# Patient Record
Sex: Female | Born: 1970 | Hispanic: No | Marital: Married | Smoking: Former smoker
Health system: Southern US, Community
[De-identification: ages and names within clinical notes are randomized; demographics above are authoritative.]

## PROBLEM LIST (undated history)

## (undated) DIAGNOSIS — J45909 Unspecified asthma, uncomplicated: Secondary | ICD-10-CM

## (undated) DIAGNOSIS — K219 Gastro-esophageal reflux disease without esophagitis: Secondary | ICD-10-CM

## (undated) DIAGNOSIS — F32A Depression, unspecified: Secondary | ICD-10-CM

## (undated) DIAGNOSIS — B001 Herpesviral vesicular dermatitis: Secondary | ICD-10-CM

## (undated) DIAGNOSIS — E063 Autoimmune thyroiditis: Secondary | ICD-10-CM

## (undated) DIAGNOSIS — F988 Other specified behavioral and emotional disorders with onset usually occurring in childhood and adolescence: Secondary | ICD-10-CM

## (undated) DIAGNOSIS — Z79899 Other long term (current) drug therapy: Secondary | ICD-10-CM

## (undated) DIAGNOSIS — C4491 Basal cell carcinoma of skin, unspecified: Secondary | ICD-10-CM

## (undated) DIAGNOSIS — F329 Major depressive disorder, single episode, unspecified: Secondary | ICD-10-CM

## (undated) DIAGNOSIS — E079 Disorder of thyroid, unspecified: Secondary | ICD-10-CM

## (undated) DIAGNOSIS — T7840XA Allergy, unspecified, initial encounter: Secondary | ICD-10-CM

## (undated) DIAGNOSIS — F419 Anxiety disorder, unspecified: Secondary | ICD-10-CM

## (undated) DIAGNOSIS — R87619 Unspecified abnormal cytological findings in specimens from cervix uteri: Secondary | ICD-10-CM

## (undated) DIAGNOSIS — N979 Female infertility, unspecified: Secondary | ICD-10-CM

## (undated) DIAGNOSIS — G43909 Migraine, unspecified, not intractable, without status migrainosus: Secondary | ICD-10-CM

## (undated) DIAGNOSIS — A64 Unspecified sexually transmitted disease: Secondary | ICD-10-CM

## (undated) HISTORY — DX: Herpesviral vesicular dermatitis: B00.1

## (undated) HISTORY — DX: Anxiety disorder, unspecified: F41.9

## (undated) HISTORY — DX: Migraine, unspecified, not intractable, without status migrainosus: G43.909

## (undated) HISTORY — DX: Disorder of thyroid, unspecified: E07.9

## (undated) HISTORY — DX: Gastro-esophageal reflux disease without esophagitis: K21.9

## (undated) HISTORY — PX: TONSILLECTOMY: SUR1361

## (undated) HISTORY — PX: BREAST BIOPSY: SHX20

## (undated) HISTORY — PX: HIP ARTHROPLASTY: SHX981

## (undated) HISTORY — DX: Depression, unspecified: F32.A

## (undated) HISTORY — DX: Allergy, unspecified, initial encounter: T78.40XA

## (undated) HISTORY — DX: Autoimmune thyroiditis: E06.3

## (undated) HISTORY — DX: Unspecified asthma, uncomplicated: J45.909

## (undated) HISTORY — DX: Female infertility, unspecified: N97.9

## (undated) HISTORY — DX: Unspecified sexually transmitted disease: A64

## (undated) HISTORY — PX: BREAST EXCISIONAL BIOPSY: SUR124

## (undated) HISTORY — DX: Other specified behavioral and emotional disorders with onset usually occurring in childhood and adolescence: F98.8

---
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
## 1994-08-26 HISTORY — PX: NASAL SEPTUM SURGERY: SHX37

## 2013-08-26 DIAGNOSIS — C4491 Basal cell carcinoma of skin, unspecified: Secondary | ICD-10-CM

## 2013-08-26 HISTORY — DX: Basal cell carcinoma of skin, unspecified: C44.91

## 2014-08-26 DIAGNOSIS — R87619 Unspecified abnormal cytological findings in specimens from cervix uteri: Secondary | ICD-10-CM

## 2014-08-26 HISTORY — DX: Unspecified abnormal cytological findings in specimens from cervix uteri: R87.619

## 2015-08-27 HISTORY — PX: ABLATION: SHX5711

## 2015-08-27 HISTORY — PX: OTHER SURGICAL HISTORY: SHX169

## 2016-08-26 DIAGNOSIS — Z79899 Other long term (current) drug therapy: Secondary | ICD-10-CM

## 2016-08-26 HISTORY — DX: Other long term (current) drug therapy: Z79.899

## 2019-01-08 ENCOUNTER — Telehealth: Payer: Self-pay | Admitting: *Deleted

## 2019-01-08 ENCOUNTER — Encounter: Payer: Self-pay | Admitting: Family Medicine

## 2019-01-08 ENCOUNTER — Ambulatory Visit (INDEPENDENT_AMBULATORY_CARE_PROVIDER_SITE_OTHER): Payer: Managed Care, Other (non HMO) | Admitting: Family Medicine

## 2019-01-08 ENCOUNTER — Other Ambulatory Visit: Payer: Self-pay

## 2019-01-08 DIAGNOSIS — Z1239 Encounter for other screening for malignant neoplasm of breast: Secondary | ICD-10-CM

## 2019-01-08 DIAGNOSIS — R928 Other abnormal and inconclusive findings on diagnostic imaging of breast: Secondary | ICD-10-CM

## 2019-01-08 DIAGNOSIS — F321 Major depressive disorder, single episode, moderate: Secondary | ICD-10-CM | POA: Diagnosis not present

## 2019-01-08 DIAGNOSIS — E669 Obesity, unspecified: Secondary | ICD-10-CM | POA: Diagnosis not present

## 2019-01-08 DIAGNOSIS — C4491 Basal cell carcinoma of skin, unspecified: Secondary | ICD-10-CM

## 2019-01-08 DIAGNOSIS — Z8639 Personal history of other endocrine, nutritional and metabolic disease: Secondary | ICD-10-CM

## 2019-01-08 DIAGNOSIS — R5383 Other fatigue: Secondary | ICD-10-CM | POA: Diagnosis not present

## 2019-01-08 DIAGNOSIS — Z13220 Encounter for screening for lipoid disorders: Secondary | ICD-10-CM

## 2019-01-08 DIAGNOSIS — Z131 Encounter for screening for diabetes mellitus: Secondary | ICD-10-CM

## 2019-01-08 DIAGNOSIS — F988 Other specified behavioral and emotional disorders with onset usually occurring in childhood and adolescence: Secondary | ICD-10-CM

## 2019-01-08 DIAGNOSIS — F1010 Alcohol abuse, uncomplicated: Secondary | ICD-10-CM

## 2019-01-08 DIAGNOSIS — B001 Herpesviral vesicular dermatitis: Secondary | ICD-10-CM

## 2019-01-08 MED ORDER — BUPROPION HCL ER (SR) 150 MG PO TB12: ORAL_TABLET | ORAL | 2 refills | Status: DC

## 2019-01-08 MED ORDER — NALTREXONE HCL 50 MG PO TABS: 50.0000 mg | ORAL_TABLET | Freq: Every day | ORAL | 2 refills | Status: DC

## 2019-01-08 NOTE — Telephone Encounter (Signed)
I called the pt and scheduled a lab appt and follow up visit.  Patient was given the phone number to contact [facility] at [phone] and PHQ-9 entered in the chart.

## 2019-01-08 NOTE — Telephone Encounter (Signed)
-----   Message from Caren Macadam, MD sent at 01/08/2019  1:56 PM EDT ----- Schedule bloodwork soon please. Schedule follow up doxy in 2 weeks. Please give behavioral health # to her. Please complete phq9 on her. I will put in lab orders shortly.
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
## 2019-01-15 ENCOUNTER — Other Ambulatory Visit (INDEPENDENT_AMBULATORY_CARE_PROVIDER_SITE_OTHER): Payer: Managed Care, Other (non HMO)

## 2019-01-15 ENCOUNTER — Other Ambulatory Visit: Payer: Self-pay | Admitting: Family Medicine

## 2019-01-15 ENCOUNTER — Other Ambulatory Visit: Payer: Self-pay

## 2019-01-15 DIAGNOSIS — Z8639 Personal history of other endocrine, nutritional and metabolic disease: Secondary | ICD-10-CM | POA: Diagnosis not present

## 2019-01-15 DIAGNOSIS — Z13220 Encounter for screening for lipoid disorders: Secondary | ICD-10-CM

## 2019-01-15 DIAGNOSIS — Z131 Encounter for screening for diabetes mellitus: Secondary | ICD-10-CM

## 2019-01-15 DIAGNOSIS — R5383 Other fatigue: Secondary | ICD-10-CM | POA: Diagnosis not present

## 2019-01-15 LAB — LIPID PANEL
Cholesterol: 179 mg/dL (ref 0–200)
HDL: 88.4 mg/dL (ref >39.00)
LDL Cholesterol: 82 mg/dL (ref 0–99)
NonHDL: 90.32
Total CHOL/HDL Ratio: 2
Triglycerides: 41 mg/dL (ref 0.0–149.0)
VLDL: 8.2 mg/dL (ref 0.0–40.0)

## 2019-01-15 LAB — CBC WITH DIFFERENTIAL/PLATELET
Basophils Absolute: 0.1 10*3/uL (ref 0.0–0.1)
Basophils Relative: 1.1 % (ref 0.0–3.0)
Eosinophils Absolute: 0.3 10*3/uL (ref 0.0–0.7)
Eosinophils Relative: 5.9 % — ABNORMAL HIGH (ref 0.0–5.0)
HCT: 40.3 % (ref 36.0–46.0)
Hemoglobin: 13.9 g/dL (ref 12.0–15.0)
Lymphocytes Relative: 33 % (ref 12.0–46.0)
Lymphs Abs: 1.7 10*3/uL (ref 0.7–4.0)
MCHC: 34.6 g/dL (ref 30.0–36.0)
MCV: 93.1 fl (ref 78.0–100.0)
Monocytes Absolute: 0.4 10*3/uL (ref 0.1–1.0)
Monocytes Relative: 8.1 % (ref 3.0–12.0)
Neutro Abs: 2.6 10*3/uL (ref 1.4–7.7)
Neutrophils Relative %: 51.9 % (ref 43.0–77.0)
Platelets: 265 10*3/uL (ref 150.0–400.0)
RBC: 4.32 Mil/uL (ref 3.87–5.11)
RDW: 13.5 % (ref 11.5–15.5)
WBC: 5 10*3/uL (ref 4.0–10.5)

## 2019-01-15 LAB — COMPREHENSIVE METABOLIC PANEL
ALT: 12 U/L (ref 0–35)
AST: 15 U/L (ref 0–37)
Albumin: 4.7 g/dL (ref 3.5–5.2)
Alkaline Phosphatase: 42 U/L (ref 39–117)
BUN: 11 mg/dL (ref 6–23)
CO2: 28 mEq/L (ref 19–32)
Calcium: 9.5 mg/dL (ref 8.4–10.5)
Chloride: 102 mEq/L (ref 96–112)
Creatinine, Ser: 0.77 mg/dL (ref 0.40–1.20)
GFR: 79.75 mL/min (ref >60.00)
Glucose, Bld: 83 mg/dL (ref 70–99)
Potassium: 4.2 mEq/L (ref 3.5–5.1)
Sodium: 139 mEq/L (ref 135–145)
Total Bilirubin: 0.9 mg/dL (ref 0.2–1.2)
Total Protein: 7 g/dL (ref 6.0–8.3)

## 2019-01-15 LAB — VITAMIN B12: Vitamin B-12: 401 pg/mL (ref 211–911)

## 2019-01-15 LAB — TSH: TSH: 2.22 u[IU]/mL (ref 0.35–4.50)

## 2019-01-15 LAB — HEMOGLOBIN A1C: Hgb A1c MFr Bld: 5.1 % (ref 4.6–6.5)

## 2019-01-15 LAB — VITAMIN D 25 HYDROXY (VIT D DEFICIENCY, FRACTURES): VITD: 27.21 ng/mL — ABNORMAL LOW (ref 30.00–100.00)

## 2019-01-15 LAB — T3, FREE: T3, Free: 3.2 pg/mL (ref 2.3–4.2)

## 2019-01-15 LAB — T4, FREE: Free T4: 0.81 ng/dL (ref 0.60–1.60)

## 2019-01-19 LAB — B. BURGDORFI ANTIBODIES: B burgdorferi Ab IgG+IgM: <0.9 index

## 2019-01-22 ENCOUNTER — Ambulatory Visit (INDEPENDENT_AMBULATORY_CARE_PROVIDER_SITE_OTHER): Payer: Managed Care, Other (non HMO) | Admitting: Family Medicine

## 2019-01-22 ENCOUNTER — Encounter: Payer: Self-pay | Admitting: Family Medicine

## 2019-01-22 ENCOUNTER — Telehealth: Payer: Self-pay | Admitting: *Deleted

## 2019-01-22 ENCOUNTER — Other Ambulatory Visit: Payer: Self-pay

## 2019-01-22 DIAGNOSIS — F321 Major depressive disorder, single episode, moderate: Secondary | ICD-10-CM

## 2019-01-22 DIAGNOSIS — F988 Other specified behavioral and emotional disorders with onset usually occurring in childhood and adolescence: Secondary | ICD-10-CM | POA: Diagnosis not present

## 2019-01-22 MED ORDER — VYVANSE 50 MG PO CAPS: 50.0000 mg | ORAL_CAPSULE | Freq: Every day | ORAL | 0 refills | Status: DC

## 2019-01-22 MED ORDER — ESCITALOPRAM OXALATE 10 MG PO TABS: 10.0000 mg | ORAL_TABLET | Freq: Every day | ORAL | 1 refills | Status: DC

## 2019-01-22 NOTE — Telephone Encounter (Signed)
See results note. 

## 2019-01-22 NOTE — Telephone Encounter (Signed)
Copied from [place] [id number]. Topic: General - Other >> Jan 21, 2019  4:20 PM Celene Kras A wrote: Reason for CRM: Pt is returning call that she received. Please advise. >> Jan 21, 2019  4:48 PM Cox, Melburn Hake, CMA wrote: [facility] Agent did not see CRM giving permission to NT to give results to pt.

## 2019-01-22 NOTE — Progress Notes (Signed)
Virtual Visit via Video Note  I connected with Yolanda Santana  on 01/22/19 at 10:30 AM EDT by a video enabled telemedicine application and verified that I am speaking with the correct person using two identifiers.  Location patient: home Location provider:work or home office Persons participating in the virtual visit: patient, provider  I discussed the limitations of evaluation and management by telemedicine and the availability of in person appointments. The patient expressed understanding and agreed to proceed.   Yolanda Santana DOB: [birth year] Encounter date: 01/22/2019  This is a 48 y.o. female who presents with Chief Complaint  Patient presents with  . Follow-up    discuss labs    History of present illness: Last visit 5/15 (new patient) we discussed multiple concerns including mood/alcohol intake. Wellbutrin was added on to help with depression and naltrexone was also started. labwork completed which was stable.  HPI  Has done some thinking since our last discussion. Feels that everything going on is very situational. New move, furloughed, COVID, then going back to work, limiting exposure to kids, just a lot of stress and ups and downs.  House in [place] is still on market which is stressful. Current husband is not working right now. Feels that this will get better on own. Wanting to just work on self and get back in routine. Didn't start any medication because she felt like she could get this to turn on own.   Doing better with alcohol intake. Has cut back on this.   Worried regularly about her weight. Working on Chief Technology Officer. Likes to walk for exercise.   Needs refills on vyvanse. Stable on this medication. Uses adderall only if needed for break through focus issues.    Allergies  Allergen Reactions  . Penicillins Rash   Current Meds  Medication Sig  . amphetamine-dextroamphetamine (ADDERALL) 20 MG tablet Take 20 mg by mouth as needed.   . Cholecalciferol (VITAMIN D-3 PO) Take 5,000  Units by mouth every other day.  . escitalopram (LEXAPRO) 10 MG tablet Take 1 tablet (10 mg total) by mouth daily.  . fexofenadine (ALLEGRA) 180 MG tablet Take 180 mg by mouth as needed for allergies or rhinitis.  Marland Kitchen [place] Life total mins iron free-once a day  . OVER THE COUNTER MEDICATION Natures Life supplement-no iron-once a day  . valACYclovir (VALTREX) 1000 MG tablet 2,000 mg as needed.  Marland Kitchen VYVANSE 50 MG capsule Take 1 capsule (50 mg total) by mouth daily.  . [DISCONTINUED] buPROPion (WELLBUTRIN SR) 150 MG 12 hr tablet Start with 1 tab daily x 3 days, then increase to BID  . [DISCONTINUED] escitalopram (LEXAPRO) 10 MG tablet Take 10 mg by mouth daily.  . [DISCONTINUED] naltrexone (DEPADE) 50 MG tablet Take 1 tablet (50 mg total) by mouth daily.  . [DISCONTINUED] VYVANSE 50 MG capsule Take 50 mg by mouth daily.     Review of Systems  Constitutional: Negative for chills, fatigue and fever.  Respiratory: Negative for cough, chest tightness, shortness of breath and wheezing.   Cardiovascular: Negative for chest pain, palpitations and leg swelling.  Psychiatric/Behavioral: Nervous/anxious: feeling better overall.     Objective:  There were no vitals taken for this visit.      BP Readings from Last 3 Encounters:  No data found for BP   Wt Readings from Last 3 Encounters:  No data found for Wt    EXAM:  GENERAL: alert, oriented, appears well and in no acute distress  HEENT: atraumatic,  conjunctiva clear, no obvious abnormalities on inspection of external nose and ears  NECK: normal movements of the head and neck  LUNGS: on inspection no signs of respiratory distress, breathing rate appears normal, no obvious gross SOB, gasping or wheezing  CV: no obvious cyanosis  MS: moves all visible extremities without noticeable abnormality  PSYCH/NEURO: pleasant and cooperative, no obvious depression or anxiety, speech and thought processing grossly  intact  Assessment/Plan  1. Attention deficit disorder, unspecified hyperactivity presence Controlled.  Continue current medication.  2. Depression, major, single episode, moderate ([place]) At this point she wishes to remain on the Lexapro and not add any additional medication.  She is going to work on getting regular exercise.  We discussed working on more positive self talk.   Return in about 2 months (around 03/24/2019) for physical exam.    I discussed the assessment and treatment plan with the patient. The patient was provided an opportunity to ask questions and all were answered. The patient agreed with the plan and demonstrated an understanding of the instructions.   The patient was advised to call back or seek an in-person evaluation if the symptoms worsen or if the condition fails to improve as anticipated.  I provided 15 minutes of non-face-to-face time during this encounter.   Micheline Rough, MD

## 2019-01-26 ENCOUNTER — Telehealth: Payer: Self-pay | Admitting: *Deleted

## 2019-01-26 NOTE — Telephone Encounter (Signed)
-----   Message from Caren Macadam, MD sent at 01/22/2019 10:51 AM EDT ----- Please schedule in office physical in 2 mo time.

## 2019-01-26 NOTE — Telephone Encounter (Signed)
I left a detailed message at the pts cell number to call back with an appt as below.

## 2019-02-22 ENCOUNTER — Encounter: Payer: Self-pay | Admitting: Family Medicine

## 2019-02-22 ENCOUNTER — Other Ambulatory Visit: Payer: Self-pay

## 2019-02-22 ENCOUNTER — Ambulatory Visit (INDEPENDENT_AMBULATORY_CARE_PROVIDER_SITE_OTHER): Payer: Managed Care, Other (non HMO) | Admitting: Family Medicine

## 2019-02-22 VITALS — BP 102/80 | HR 93 | Temp 98.7°F | Ht 65.5 in | Wt 167.2 lb

## 2019-02-22 DIAGNOSIS — H6982 Other specified disorders of Eustachian tube, left ear: Secondary | ICD-10-CM

## 2019-02-22 NOTE — Progress Notes (Signed)
   Subjective:    Patient ID: Ann Lions, female    DOB: 1971-02-07, 48 y.o.   MRN: [id number]  HPI Here for longstanding fullness and muffled hearing in the left ear. There is no pain. This started months ago, but when she moved to [place] from [place] 4 months ago it got worse. She tried to clean her ear out with a Q Tip yesterday and she saw some blood on the tip of it. No sinus pressure or ST or fever.    Review of Systems  Constitutional: Negative.   HENT: Positive for hearing loss. Negative for congestion, ear discharge, ear pain, postnasal drip, sinus pressure, sinus pain and sore throat.   Eyes: Negative.   Respiratory: Negative.        Objective:   Physical Exam Constitutional:      Appearance: Normal appearance.  HENT:     Right Ear: Tympanic membrane, ear canal and external ear normal.     Left Ear: Tympanic membrane and external ear normal.     Ears:     Comments: The left canal is clear but there are several tiny abrasions with dried blood present     Nose: Nose normal.     Mouth/Throat:     Pharynx: Oropharynx is clear.  Eyes:     Conjunctiva/sclera: Conjunctivae normal.  Pulmonary:     Effort: Pulmonary effort is normal.     Breath sounds: Normal breath sounds.  Lymphadenopathy:     Cervical: No cervical adenopathy.  Neurological:     Mental Status: She is alert.           Assessment & Plan:  She has eustachian tube dysfunction and she recently abraded the canal. I told her to never put anything, including Q Tips, in the ear canals. She will try Allegra and Flonase every day.  Alysia Penna, MD

## 2019-03-10 ENCOUNTER — Telehealth: Payer: Self-pay | Admitting: Family Medicine

## 2019-03-10 ENCOUNTER — Other Ambulatory Visit: Payer: Self-pay | Admitting: Family Medicine

## 2019-03-10 MED ORDER — VYVANSE 50 MG PO CAPS: 50.0000 mg | ORAL_CAPSULE | Freq: Every day | ORAL | 0 refills | Status: DC

## 2019-03-10 NOTE — Telephone Encounter (Signed)
Medication Refill - Medication: VYVANSE 50 MG capsule and amphetamine-dextroamphetamine (ADDERALL) 20 MG tablet     Preferred Pharmacy (with phone number or street name):  [id number] - [place], [address]. AT [address] [phone] (Phone) [phone] (Fax)

## 2019-03-18 ENCOUNTER — Telehealth: Payer: Self-pay | Admitting: *Deleted

## 2019-03-18 NOTE — Telephone Encounter (Signed)
Copied from [place] [id number]. Topic: Appointment Scheduling - Scheduling Inquiry for Clinic >> Mar 18, 2019  1:45 PM Percell Belt A wrote: Reason for CRM: pt would called and like to see if there is a cancellation tomorrow for cpe.  If not she would like to try and get something sooner  Best number  [phone]  CLinic RN called patient. No answer. Unable to LVM

## 2019-03-29 NOTE — Telephone Encounter (Signed)
I called the pt and rescheduled CPE to 8/5 due to cancellation.

## 2019-03-31 ENCOUNTER — Ambulatory Visit (INDEPENDENT_AMBULATORY_CARE_PROVIDER_SITE_OTHER): Payer: Managed Care, Other (non HMO) | Admitting: Family Medicine

## 2019-03-31 ENCOUNTER — Other Ambulatory Visit (HOSPITAL_COMMUNITY)
Admission: RE | Admit: 2019-03-31 | Discharge: 2019-03-31 | Disposition: A | Discharge status: Home / Self Care | Payer: Managed Care, Other (non HMO) | Source: Ambulatory Visit | Attending: Family Medicine | Admitting: Family Medicine

## 2019-03-31 ENCOUNTER — Other Ambulatory Visit: Payer: Self-pay

## 2019-03-31 ENCOUNTER — Encounter: Payer: Self-pay | Admitting: Family Medicine

## 2019-03-31 VITALS — BP 110/72 | HR 84 | Temp 97.2°F | Ht 64.75 in | Wt 173.4 lb

## 2019-03-31 DIAGNOSIS — Z0000 Encounter for general adult medical examination without abnormal findings: Secondary | ICD-10-CM

## 2019-03-31 DIAGNOSIS — K582 Mixed irritable bowel syndrome: Secondary | ICD-10-CM

## 2019-03-31 DIAGNOSIS — Z124 Encounter for screening for malignant neoplasm of cervix: Secondary | ICD-10-CM

## 2019-03-31 DIAGNOSIS — D229 Melanocytic nevi, unspecified: Secondary | ICD-10-CM

## 2019-03-31 DIAGNOSIS — F321 Major depressive disorder, single episode, moderate: Secondary | ICD-10-CM

## 2019-03-31 DIAGNOSIS — E663 Overweight: Secondary | ICD-10-CM

## 2019-03-31 DIAGNOSIS — H60549 Acute eczematoid otitis externa, unspecified ear: Secondary | ICD-10-CM

## 2019-03-31 DIAGNOSIS — F1010 Alcohol abuse, uncomplicated: Secondary | ICD-10-CM

## 2019-03-31 MED ORDER — NALTREXONE HCL 50 MG PO TABS: 50.0000 mg | ORAL_TABLET | Freq: Every day | ORAL | 2 refills | Status: DC

## 2019-03-31 MED ORDER — NEOMYCIN-POLYMYXIN-HC 3.5-10000-1 OT SOLN: 3.0000 [drp] | Freq: Four times a day (QID) | OTIC | 0 refills | Status: DC | PRN

## 2019-03-31 MED ORDER — BUPROPION HCL ER (SR) 150 MG PO TB12: ORAL_TABLET | ORAL | 2 refills | Status: DC
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
## 2019-03-31 NOTE — Patient Instructions (Signed)
Why is Exercise Important? If I told you I had a single pill that would help you decrease stress by improving anxiety, decreasing depression, help you achieve a healthy weight, give you more energy, make you more productive, help you focus, decrease your risk of dementia/heart attack/stroke/falls, improve your bone health, and more would you be interested? These are just some of the benefits that exercise brings to you. IT IS WORTH carving out some time every day to fit in exercise. It will help in every aspect of your health. Even if you have injuries that prevent you from participating in a type of exercise you used to do; there is always something that you can do to keep exercise a part of your life. If improving your health is important, make exercise your priority. It is worth the time! If you have questions about the type of exercise that is right for you, please talk with me about this!     Exercising to Stay Healthy  Exercising regularly is important. It has many health benefits, such as:  Improving your overall fitness, flexibility, and endurance.  Increasing your bone density.  Helping with weight control.  Decreasing your body fat.  Increasing your muscle strength.  Reducing stress and tension.  Improving your overall health.   In order to become healthy and stay healthy, it is recommended that you do moderate-intensity and vigorous-intensity exercise. You can tell that you are exercising at a moderate intensity if you have a higher heart rate and faster breathing, but you are still able to hold a conversation. You can tell that you are exercising at a vigorous intensity if you are breathing much harder and faster and cannot hold a conversation while exercising. How often should I exercise? Choose an activity that you enjoy and set realistic goals. Your health care provider can help you to make an activity plan that works for you. Exercise regularly as directed by your health care  provider. This may include:  Doing resistance training twice each week, such as: ? Push-ups. ? Sit-ups. ? Lifting weights. ? Using resistance bands.  Doing a given intensity of exercise for a given amount of time. Choose from these options: ? 150 minutes of moderate-intensity exercise every week. ? 75 minutes of vigorous-intensity exercise every week. ? A mix of moderate-intensity and vigorous-intensity exercise every week.   Children, pregnant women, people who are out of shape, people who are overweight, and older adults may need to consult a health care provider for individual recommendations. If you have any sort of medical condition, be sure to consult your health care provider before starting a new exercise program. What are some exercise ideas? Some moderate-intensity exercise ideas include:  Walking at a rate of 1 mile in 15 minutes.  Biking.  Hiking.  Golfing.  Dancing.   Some vigorous-intensity exercise ideas include:  Walking at a rate of at least 4.5 miles per hour.  Jogging or running at a rate of 5 miles per hour.  Biking at a rate of at least 10 miles per hour.  Lap swimming.  Roller-skating or in-line skating.  Cross-country skiing.  Vigorous competitive sports, such as football, basketball, and soccer.  Jumping rope.  Aerobic dancing.   What are some everyday activities that can help me to get exercise?  Yard work, such as: ? Pushing a lawn mower. ? Raking and bagging leaves.  Washing and waxing your car.  Pushing a stroller.  Shoveling snow.  Gardening.  Washing windows or   floors. How can I be more active in my day-to-day activities?  Use the stairs instead of the elevator.  Take a walk during your lunch break.  If you drive, park your car farther away from work or school.  If you take public transportation, get off one stop early and walk the rest of the way.  Make all of your phone calls while standing up and walking  around.  Get up, stretch, and walk around every 30 minutes throughout the day. What guidelines should I follow while exercising?  Do not exercise so much that you hurt yourself, feel dizzy, or get very short of breath.  Consult your health care provider before starting a new exercise program.  Wear comfortable clothes and shoes with good support.  Drink plenty of water while you exercise to prevent dehydration or heat stroke. Body water is lost during exercise and must be replaced.  Work out until you breathe faster and your heart beats faster. This information is not intended to replace advice given to you by your health care provider. Make sure you discuss any questions you have with your health care provider.  

## 2019-04-01 ENCOUNTER — Telehealth: Payer: Self-pay | Admitting: *Deleted

## 2019-04-01 NOTE — Telephone Encounter (Signed)
I left a message for the pt to return my call.  CRM also created. 

## 2019-04-01 NOTE — Telephone Encounter (Signed)
I called the pt and informed her of the message below

## 2019-04-01 NOTE — Telephone Encounter (Signed)
Pt calling Denice Paradise ann back. Pt states that she can leave a detailed message on phone. Please advise   CB#[phone]

## 2019-04-01 NOTE — Telephone Encounter (Signed)
-----   Message from Caren Macadam, MD sent at 03/31/2019  1:10 PM EDT ----- I don't see that she is set up in mychart so I can't send message. Just wanted to follow up on a couple of things I didn't get to tell her before she left. 1. I sent in ear drops. She can use these whenever ear canals are itchy. If needing more than a week or if worsening discomfort after starting drops needs to be seen. 2. Contrave doesn't look covered, but I resent the individual components of medication. She could certainly just try the wellbutrin component first and not the naltrexone if desired. The wellbutrin would help with mood and does help some with impulse control. Then if not feeling effective can add in naltrexone. Close 1-2 mo follow up to recheck weight/mood.

## 2019-04-05 LAB — CYTOLOGY - PAP
Diagnosis: NEGATIVE
HPV: NOT DETECTED

## 2019-04-07 ENCOUNTER — Telehealth (INDEPENDENT_AMBULATORY_CARE_PROVIDER_SITE_OTHER): Payer: Managed Care, Other (non HMO) | Admitting: Internal Medicine

## 2019-04-07 ENCOUNTER — Other Ambulatory Visit: Payer: Self-pay

## 2019-04-07 DIAGNOSIS — J988 Other specified respiratory disorders: Secondary | ICD-10-CM

## 2019-04-07 DIAGNOSIS — U071 COVID-19: Secondary | ICD-10-CM
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
## 2019-04-08 ENCOUNTER — Other Ambulatory Visit: Payer: Self-pay

## 2019-04-08 DIAGNOSIS — Z20822 Contact with and (suspected) exposure to covid-19: Secondary | ICD-10-CM

## 2019-04-09 LAB — NOVEL CORONAVIRUS, NAA: SARS-CoV-2, NAA: NOT DETECTED

## 2019-04-13 ENCOUNTER — Ambulatory Visit
Admission: RE | Admit: 2019-04-13 | Discharge: 2019-04-13 | Disposition: A | Discharge status: Home / Self Care | Payer: Managed Care, Other (non HMO) | Source: Ambulatory Visit | Attending: Family Medicine | Admitting: Family Medicine

## 2019-04-13 ENCOUNTER — Other Ambulatory Visit: Payer: Self-pay

## 2019-04-13 DIAGNOSIS — Z1239 Encounter for other screening for malignant neoplasm of breast: Secondary | ICD-10-CM

## 2019-04-15 ENCOUNTER — Other Ambulatory Visit: Payer: Self-pay | Admitting: Family Medicine

## 2019-04-16 MED ORDER — VYVANSE 50 MG PO CAPS: 50.0000 mg | ORAL_CAPSULE | Freq: Every day | ORAL | 0 refills | Status: DC

## 2019-04-23 ENCOUNTER — Ambulatory Visit: Payer: Managed Care, Other (non HMO) | Admitting: Family Medicine

## 2019-05-12 ENCOUNTER — Encounter: Payer: Managed Care, Other (non HMO) | Admitting: Family Medicine

## 2019-05-15 ENCOUNTER — Other Ambulatory Visit: Payer: Self-pay | Admitting: Family Medicine

## 2019-05-17 MED ORDER — VYVANSE 50 MG PO CAPS: 50.0000 mg | ORAL_CAPSULE | Freq: Every day | ORAL | 0 refills | Status: DC

## 2019-05-17 NOTE — Telephone Encounter (Signed)
Pt's spouse calling to check on this - states that pt took her last pill this morning and needs refill at this time.

## 2019-05-17 NOTE — Telephone Encounter (Signed)
Message routed to PCP CMA  

## 2019-05-31 ENCOUNTER — Encounter: Payer: Self-pay | Admitting: Gastroenterology

## 2019-06-01 ENCOUNTER — Telehealth (INDEPENDENT_AMBULATORY_CARE_PROVIDER_SITE_OTHER): Payer: Managed Care, Other (non HMO) | Admitting: Family Medicine

## 2019-06-01 ENCOUNTER — Ambulatory Visit: Payer: Self-pay | Admitting: Family Medicine

## 2019-06-01 ENCOUNTER — Other Ambulatory Visit: Payer: Self-pay

## 2019-06-01 DIAGNOSIS — R1031 Right lower quadrant pain: Secondary | ICD-10-CM | POA: Diagnosis not present

## 2019-06-01 DIAGNOSIS — R1011 Right upper quadrant pain: Secondary | ICD-10-CM | POA: Diagnosis not present

## 2019-06-01 DIAGNOSIS — R112 Nausea with vomiting, unspecified: Secondary | ICD-10-CM | POA: Diagnosis not present

## 2019-06-01 DIAGNOSIS — R197 Diarrhea, unspecified: Secondary | ICD-10-CM | POA: Diagnosis not present

## 2019-06-01 NOTE — Telephone Encounter (Signed)
Pt scheduled for virtual visit 

## 2019-06-01 NOTE — Telephone Encounter (Signed)
Pt reports nausea, vomiting and diarrhea over weekend, increased fatigue and headache. No N/V/D yesterday or today. Onset today of right sided abdominal pain, "From under rib cage to hip." States radiates to lower back. States intermittnet 6/10 at times. Reports urine malodorous, denies dysuria. Pt has H/O IBS, has GI appt in November. States this pain is "Different."  Questioning if she could bring urine specimen in later today. Call placed to practice, Izora Gala, for consideration of appt. today.  Advised to route triage summary. Pt aware; advised ED if pain worsens.  CB# [id number]  Reason for Disposition . [1] MODERATE pain (e.g., interferes with normal activities) AND [2] pain comes and goes (cramps) AND [3] present > 24 hours  (Exception: pain with Vomiting or Diarrhea - see that Guideline)  Answer Assessment - Initial Assessment Questions 1. LOCATION: "Where does it hurt?"      Right side 'From under ribs to hip" 2. RADIATION: "Does the pain shoot anywhere else?" (e.g., chest, back)     Right lower back 3. ONSET: "When did the pain begin?" (e.g., minutes, hours or days ago)      Yesterday  4. SUDDEN: "Gradual or sudden onset?"    gradula 5. PATTERN "Does the pain come and go, or is it constant?"    - If constant: "Is it getting better, staying the same, or worsening?"      (Note: Constant means the pain never goes away completely; most serious pain is constant and it progresses)     - If intermittent: "How long does it last?" "Do you have pain now?"     (Note: Intermittent means the pain goes away completely between bouts)     Comes and goes, intensity varies 6. SEVERITY: "How bad is the pain?"  (e.g., Scale 1-10; mild, moderate, or severe)   - MILD (1-3): doesn't interfere with normal activities, abdomen soft and not tender to touch    - MODERATE (4-7): interferes with normal activities or awakens from sleep, tender to touch    - SEVERE (8-10): excruciating pain, doubled over, unable  to do any normal activities      6/10 at times 7. RECURRENT SYMPTOM: "Have you ever had this type of abdominal pain before?" If so, ask: "When was the last time?" and "What happened that time?"      H/O IBS. "Different area." 8. CAUSE: "What do you think is causing the abdominal pain?"     Maybe UTI 9. RELIEVING/AGGRAVATING FACTORS: "What makes it better or worse?" (e.g., movement, antacids, bowel movement)     no 10. OTHER SYMPTOMS: "Has there been any vomiting, diarrhea, constipation, or urine problems?"       N/V/D over weekend, none yesterday or today  Protocols used: ABDOMINAL PAIN - [facility]
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
## 2019-06-03 ENCOUNTER — Encounter (HOSPITAL_COMMUNITY): Payer: Self-pay

## 2019-06-03 ENCOUNTER — Ambulatory Visit (INDEPENDENT_AMBULATORY_CARE_PROVIDER_SITE_OTHER): Payer: Managed Care, Other (non HMO)

## 2019-06-03 ENCOUNTER — Ambulatory Visit (HOSPITAL_COMMUNITY)
Admission: EM | Admit: 2019-06-03 | Discharge: 2019-06-03 | Disposition: A | Discharge status: Home / Self Care | Payer: Managed Care, Other (non HMO) | Attending: Family Medicine | Admitting: Family Medicine

## 2019-06-03 ENCOUNTER — Other Ambulatory Visit: Payer: Self-pay

## 2019-06-03 DIAGNOSIS — Z20828 Contact with and (suspected) exposure to other viral communicable diseases: Secondary | ICD-10-CM | POA: Insufficient documentation

## 2019-06-03 DIAGNOSIS — K580 Irritable bowel syndrome with diarrhea: Secondary | ICD-10-CM | POA: Diagnosis not present

## 2019-06-03 DIAGNOSIS — Z3202 Encounter for pregnancy test, result negative: Secondary | ICD-10-CM | POA: Diagnosis not present

## 2019-06-03 DIAGNOSIS — R1084 Generalized abdominal pain: Secondary | ICD-10-CM

## 2019-06-03 DIAGNOSIS — Z20822 Contact with and (suspected) exposure to covid-19: Secondary | ICD-10-CM

## 2019-06-03 LAB — CBC
HCT: 40.8 % (ref 36.0–46.0)
Hemoglobin: 13.7 g/dL (ref 12.0–15.0)
MCH: 32 pg (ref 26.0–34.0)
MCHC: 33.6 g/dL (ref 30.0–36.0)
MCV: 95.3 fL (ref 80.0–100.0)
Platelets: 246 10*3/uL (ref 150–400)
RBC: 4.28 MIL/uL (ref 3.87–5.11)
RDW: 11.9 % (ref 11.5–15.5)
WBC: 8.2 10*3/uL (ref 4.0–10.5)
nRBC: 0 % (ref 0.0–0.2)

## 2019-06-03 LAB — POCT URINALYSIS DIP (DEVICE)
Bilirubin Urine: NEGATIVE
Glucose, UA: NEGATIVE mg/dL
Ketones, ur: NEGATIVE mg/dL
Leukocytes,Ua: NEGATIVE
Nitrite: NEGATIVE
Protein, ur: NEGATIVE mg/dL
Specific Gravity, Urine: 1.025 (ref 1.005–1.030)
Urobilinogen, UA: 0.2 mg/dL (ref 0.0–1.0)
pH: 5 (ref 5.0–8.0)

## 2019-06-03 LAB — POCT PREGNANCY, URINE: Preg Test, Ur: NEGATIVE

## 2019-06-03 NOTE — Discharge Instructions (Signed)
Your CBC is normal. Urinalysis is normal.  There is trace hematuria but this is needs to be reported on your next urine test.  It is not unusual Coronavirus test is still pending.  You need to isolated home until this test is available. X-rays are read as normal. Follow up with your PCP

## 2019-06-03 NOTE — ED Triage Notes (Signed)
Pt reports abdominal pain, headache, back pain, headache, dizziness and fatigue x 4 days.
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
## 2019-06-04 ENCOUNTER — Other Ambulatory Visit: Payer: Self-pay | Admitting: Family Medicine

## 2019-06-04 ENCOUNTER — Ambulatory Visit: Payer: Managed Care, Other (non HMO) | Admitting: Family Medicine

## 2019-06-04 ENCOUNTER — Telehealth: Payer: Self-pay

## 2019-06-04 DIAGNOSIS — R109 Unspecified abdominal pain: Secondary | ICD-10-CM

## 2019-06-04 DIAGNOSIS — R10811 Right upper quadrant abdominal tenderness: Secondary | ICD-10-CM

## 2019-06-04 LAB — NOVEL CORONAVIRUS, NAA (HOSP ORDER, SEND-OUT TO REF LAB; TAT 18-24 HRS): SARS-CoV-2, NAA: NOT DETECTED

## 2019-06-04 NOTE — Telephone Encounter (Signed)
Pt was seen by Dr. Maudie Mercury 10/6 and at [facility] 10/8 and would like to have abdominal CT ordered. She is not any better and would like to have this done soon.   Please advise.

## 2019-06-04 NOTE — Telephone Encounter (Signed)
I called the pt and she stated since Sunday she has had vomiting, severe RUQ pain and right flank pain, diarrhea, headache, muscle and joint pain and dizziness.  Stated the abdominal pain has subsided and she still complains of low back pain, headache and dizziness.  Stated she does have IBS and was told by Dr Maudie Mercury she suspected her symptoms could be due to her gallbladder as she has these same symptoms every few weeks and she advised a CT.  She stated the urgent care declined ordering the CT and told her they would only order an x-ray.  Message sent to Dr Ethlyn Gallery.

## 2019-06-04 NOTE — Telephone Encounter (Signed)
Called the pt and scheduled an appt for 10/14 at 10:20am.

## 2019-06-04 NOTE — Telephone Encounter (Signed)
I have read through both notes, and I don't feel like I have a clear picture of symptoms.   Looks like abdominal exam yesterday was stable. Doc she saw yesterday documented that she was interested in [place] and in CXR? Labwork looked good. I feel like I would need more information in order to proceed with testing.   I need to know specifics about current symptoms, etc. Without doing exam myself I am not sure I will be comfortable with ordering a CT. I can let her know if you can get thorough update of symptoms.

## 2019-06-04 NOTE — Telephone Encounter (Signed)
Please call her back. We spoke on phone. Please call her and set up lab visit for her for next week. Send back so I can document our discussion by phone.

## 2019-06-05 ENCOUNTER — Telehealth: Payer: Self-pay | Admitting: Family Medicine

## 2019-06-05 DIAGNOSIS — R319 Hematuria, unspecified: Secondary | ICD-10-CM

## 2019-06-05 NOTE — Telephone Encounter (Signed)
I called Yolanda Santana to discuss her symptoms.  She is very frustrated since she felt she spends time having 2 visits this week and was not able to get any answers with regards to her symptoms.  She has been missing work because of this and is frustrated with not feeling well and not being able to complete an evaluation.  She is feeling better now in terms of her nausea, vomiting, and abdominal pain.  She states that she is having cyclical symptoms with the same constellation of headaches, nausea, vomiting, diarrhea and this is causing her to miss work.  Episodes seem to start with some right upper quadrant pain that radiates around to the flank.  She currently has some residual lower back pain but feels that the upper quadrant discomfort has improved.  She has not had formed stool in a couple of months.  She feels she is feeling up faster when she eats and has a loss of appetite.  She is not had any weight loss.  She feels she has made healthy changes that should help with how she feels including cutting out alcohol and quitting smoking but she has not felt improvement with these things.  She also states she feels like her bladder does not always empty completely.  She states she has had at least 6 similar episodes since February and has missed work four times for these.  We reviewed the blood in her urine.  We reviewed normal CBC that was done in urgent care.  We discussed that this decreases risk of something infectious in etiology.  We discussed further diagnostic options at this point.  If she was still having significant abdominal pain, I think a CT of the abdomen and pelvis would be appropriate.  This may still be indicated at some point.  Since her abdomen is feeling better, we are going to pursue a abdominal ultrasound to evaluate the right upper quadrant discomfort.  Episodes are cyclic including right upper quadrant pain with some nausea and diarrhea, so I think evaluating the gallbladder closer is  reasonable.  She does have a follow-up with Dr. Loletha Carrow on November 4 which is good.  We will continue to see what evaluation we can obtain prior to that appointment to help with establishing a diagnosis.  We discussed that if she has any worsening of her symptoms she should proceed to the ER for evaluation.  She agrees to do so.

## 2019-06-09 ENCOUNTER — Other Ambulatory Visit: Payer: Self-pay

## 2019-06-09 ENCOUNTER — Other Ambulatory Visit (INDEPENDENT_AMBULATORY_CARE_PROVIDER_SITE_OTHER): Payer: Managed Care, Other (non HMO)

## 2019-06-09 DIAGNOSIS — R109 Unspecified abdominal pain: Secondary | ICD-10-CM | POA: Diagnosis not present

## 2019-06-09 DIAGNOSIS — R319 Hematuria, unspecified: Secondary | ICD-10-CM | POA: Diagnosis not present

## 2019-06-09 LAB — COMPREHENSIVE METABOLIC PANEL
ALT: 12 U/L (ref 0–35)
AST: 15 U/L (ref 0–37)
Albumin: 4.4 g/dL (ref 3.5–5.2)
Alkaline Phosphatase: 39 U/L (ref 39–117)
BUN: 9 mg/dL (ref 6–23)
CO2: 30 mEq/L (ref 19–32)
Calcium: 9.3 mg/dL (ref 8.4–10.5)
Chloride: 104 mEq/L (ref 96–112)
Creatinine, Ser: 0.81 mg/dL (ref 0.40–1.20)
GFR: 75.1 mL/min (ref >60.00)
Glucose, Bld: 84 mg/dL (ref 70–99)
Potassium: 4.2 mEq/L (ref 3.5–5.1)
Sodium: 139 mEq/L (ref 135–145)
Total Bilirubin: 0.6 mg/dL (ref 0.2–1.2)
Total Protein: 6.4 g/dL (ref 6.0–8.3)

## 2019-06-11 LAB — URINE CULTURE
MICRO NUMBER:: 989179
SPECIMEN QUALITY:: ADEQUATE

## 2019-06-13 ENCOUNTER — Emergency Department (HOSPITAL_COMMUNITY): Payer: Managed Care, Other (non HMO)

## 2019-06-13 ENCOUNTER — Emergency Department (HOSPITAL_COMMUNITY)
Admission: EM | Admit: 2019-06-13 | Discharge: 2019-06-13 | Disposition: A | Discharge status: Home / Self Care | Payer: Managed Care, Other (non HMO) | Attending: Emergency Medicine | Admitting: Emergency Medicine

## 2019-06-13 ENCOUNTER — Other Ambulatory Visit: Payer: Self-pay

## 2019-06-13 ENCOUNTER — Encounter (HOSPITAL_COMMUNITY): Payer: Self-pay

## 2019-06-13 DIAGNOSIS — R52 Pain, unspecified: Secondary | ICD-10-CM

## 2019-06-13 DIAGNOSIS — R1084 Generalized abdominal pain: Secondary | ICD-10-CM | POA: Insufficient documentation

## 2019-06-13 DIAGNOSIS — R42 Dizziness and giddiness: Secondary | ICD-10-CM | POA: Insufficient documentation

## 2019-06-13 DIAGNOSIS — F17210 Nicotine dependence, cigarettes, uncomplicated: Secondary | ICD-10-CM | POA: Diagnosis not present

## 2019-06-13 DIAGNOSIS — Z79899 Other long term (current) drug therapy: Secondary | ICD-10-CM | POA: Diagnosis not present

## 2019-06-13 DIAGNOSIS — R519 Headache, unspecified: Secondary | ICD-10-CM | POA: Diagnosis not present

## 2019-06-13 LAB — COMPREHENSIVE METABOLIC PANEL
ALT: 16 U/L (ref 0–44)
AST: 26 U/L (ref 15–41)
Albumin: 4.5 g/dL (ref 3.5–5.0)
Alkaline Phosphatase: 39 U/L (ref 38–126)
Anion gap: 8 (ref 5–15)
BUN: 12 mg/dL (ref 6–20)
CO2: 26 mmol/L (ref 22–32)
Calcium: 9 mg/dL (ref 8.9–10.3)
Chloride: 101 mmol/L (ref 98–111)
Creatinine, Ser: 0.77 mg/dL (ref 0.44–1.00)
GFR calc Af Amer: >60 mL/min (ref >60)
GFR calc non Af Amer: >60 mL/min (ref >60)
Glucose, Bld: 92 mg/dL (ref 70–99)
Potassium: 3.9 mmol/L (ref 3.5–5.1)
Sodium: 135 mmol/L (ref 135–145)
Total Bilirubin: 0.7 mg/dL (ref 0.3–1.2)
Total Protein: 7.5 g/dL (ref 6.5–8.1)

## 2019-06-13 LAB — CBC
HCT: 45.5 % (ref 36.0–46.0)
Hemoglobin: 14.5 g/dL (ref 12.0–15.0)
MCH: 31.2 pg (ref 26.0–34.0)
MCHC: 31.9 g/dL (ref 30.0–36.0)
MCV: 97.8 fL (ref 80.0–100.0)
Platelets: 283 10*3/uL (ref 150–400)
RBC: 4.65 MIL/uL (ref 3.87–5.11)
RDW: 12.3 % (ref 11.5–15.5)
WBC: 7.7 10*3/uL (ref 4.0–10.5)
nRBC: 0 % (ref 0.0–0.2)

## 2019-06-13 LAB — URINALYSIS, ROUTINE W REFLEX MICROSCOPIC
Bilirubin Urine: NEGATIVE
Glucose, UA: NEGATIVE mg/dL
Hgb urine dipstick: NEGATIVE
Ketones, ur: NEGATIVE mg/dL
Leukocytes,Ua: NEGATIVE
Nitrite: NEGATIVE
Protein, ur: NEGATIVE mg/dL
Specific Gravity, Urine: 1.014 (ref 1.005–1.030)
pH: 5 (ref 5.0–8.0)

## 2019-06-13 LAB — LIPASE, BLOOD: Lipase: 27 U/L (ref 11–51)

## 2019-06-13 LAB — HCG, SERUM, QUALITATIVE: Preg, Serum: NEGATIVE

## 2019-06-13 MED ORDER — IOHEXOL 300 MG/ML  SOLN
100.0000 mL | Freq: Once | INTRAMUSCULAR | Status: AC | PRN
  Administered 2019-06-13: 100 mL via INTRAVENOUS

## 2019-06-13 MED ORDER — SODIUM CHLORIDE 0.9% FLUSH
3.0000 mL | Freq: Once | INTRAVENOUS | Status: AC
  Administered 2019-06-13: 3 mL via INTRAVENOUS

## 2019-06-13 MED ORDER — SODIUM CHLORIDE 0.9 % IV SOLN: INTRAVENOUS | Status: DC
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
## 2019-06-13 NOTE — Discharge Instructions (Addendum)
Work-up here today without any specific abnormalities.  Labs were normal as well CT scan abdomen and pelvis CT head and x-rays of the thoracic spine without any acute findings.  Would recommend follow-up with gastroenterology as planned.  Return for any new or worse symptoms.  Today's results are reassuring on the big picture.

## 2019-06-13 NOTE — ED Triage Notes (Signed)
Pt reports to ED with complaints of abdominal pain, headache, back pain, fatigue and neck pain. Pt also having nausea and vomiting. Pt had episode last night. Pt states she has been seen for the same thing on 10/8

## 2019-06-13 NOTE — ED Notes (Signed)
Pt expressed understanding of discharge instructions and follow up

## 2019-06-13 NOTE — ED Notes (Signed)
Patient transported to CT 

## 2019-06-13 NOTE — ED Notes (Signed)
Notified need for urine sample.

## 2019-06-15 ENCOUNTER — Encounter: Payer: Self-pay | Admitting: Family Medicine

## 2019-06-17 ENCOUNTER — Other Ambulatory Visit: Payer: Self-pay | Admitting: Family Medicine

## 2019-06-18 MED ORDER — VYVANSE 50 MG PO CAPS: 50.0000 mg | ORAL_CAPSULE | Freq: Every day | ORAL | 0 refills | Status: DC

## 2019-06-23 ENCOUNTER — Other Ambulatory Visit: Payer: Self-pay | Admitting: Family Medicine

## 2019-06-23 DIAGNOSIS — N83202 Unspecified ovarian cyst, left side: Secondary | ICD-10-CM

## 2019-06-30 ENCOUNTER — Encounter

## 2019-06-30 ENCOUNTER — Ambulatory Visit: Payer: Managed Care, Other (non HMO) | Admitting: Gastroenterology

## 2019-07-02 ENCOUNTER — Other Ambulatory Visit: Payer: Self-pay | Admitting: Diagnostic Radiology

## 2019-07-02 ENCOUNTER — Ambulatory Visit: Admission: RE | Admit: 2019-07-02 | Payer: Managed Care, Other (non HMO) | Source: Ambulatory Visit

## 2019-07-02 ENCOUNTER — Other Ambulatory Visit: Payer: Self-pay | Admitting: Family Medicine

## 2019-07-02 ENCOUNTER — Other Ambulatory Visit: Payer: Self-pay

## 2019-07-02 ENCOUNTER — Ambulatory Visit
Admission: RE | Admit: 2019-07-02 | Discharge: 2019-07-02 | Disposition: A | Discharge status: Home / Self Care | Payer: Managed Care, Other (non HMO) | Source: Ambulatory Visit | Attending: Family Medicine | Admitting: Family Medicine

## 2019-07-02 ENCOUNTER — Telehealth: Payer: Self-pay | Admitting: *Deleted

## 2019-07-02 DIAGNOSIS — R10811 Right upper quadrant abdominal tenderness: Secondary | ICD-10-CM

## 2019-07-02 DIAGNOSIS — N83202 Unspecified ovarian cyst, left side: Secondary | ICD-10-CM

## 2019-07-02 DIAGNOSIS — R1011 Right upper quadrant pain: Secondary | ICD-10-CM

## 2019-07-02 NOTE — Telephone Encounter (Signed)
Yolanda Santana called from [facility] stating the pt is there now with two different orders from 10/9 and 10/28 and she wanted to know if the pt needed the US pelvis with trans vag?  Dr Ethlyn Gallery was informed and stated she requests the test in which the gallbladder and ovarian cysts be evaluated.  Yolanda Santana stated in order to cover the areas mentioned, pt needs an US pelvis transvaginal and she needs approval to change in EPIC.  Verbal order given per Dr Ethlyn Gallery.

## 2019-07-15 ENCOUNTER — Other Ambulatory Visit: Payer: Self-pay | Admitting: Family Medicine

## 2019-07-16 MED ORDER — VYVANSE 50 MG PO CAPS: 50.0000 mg | ORAL_CAPSULE | Freq: Every day | ORAL | 0 refills | Status: DC

## 2019-08-13 ENCOUNTER — Other Ambulatory Visit: Payer: Self-pay | Admitting: Family Medicine

## 2019-08-13 MED ORDER — VYVANSE 50 MG PO CAPS: 50.0000 mg | ORAL_CAPSULE | Freq: Every day | ORAL | 0 refills | Status: DC

## 2019-08-18 ENCOUNTER — Other Ambulatory Visit: Payer: Self-pay | Admitting: Family Medicine

## 2019-08-19 ENCOUNTER — Encounter: Payer: Self-pay | Admitting: Gastroenterology

## 2019-08-19 ENCOUNTER — Ambulatory Visit: Payer: Managed Care, Other (non HMO) | Admitting: Gastroenterology

## 2019-08-19 VITALS — BP 104/64 | HR 80 | Temp 98.4°F | Ht 65.5 in | Wt 170.1 lb

## 2019-08-19 DIAGNOSIS — R1084 Generalized abdominal pain: Secondary | ICD-10-CM | POA: Diagnosis not present

## 2019-08-19 DIAGNOSIS — R112 Nausea with vomiting, unspecified: Secondary | ICD-10-CM

## 2019-08-19 DIAGNOSIS — K582 Mixed irritable bowel syndrome: Secondary | ICD-10-CM | POA: Diagnosis not present

## 2019-08-19 DIAGNOSIS — K625 Hemorrhage of anus and rectum: Secondary | ICD-10-CM | POA: Diagnosis not present
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
## 2019-08-19 NOTE — Patient Instructions (Addendum)
If you are age 49 or older, your body mass index should be between 23-30. Your Body mass index is 27.88 kg/m. If this is out of the aforementioned range listed, please consider follow up with your Primary Care Provider.  If you are age 49 or younger, your body mass index should be between 19-25. Your Body mass index is 27.88 kg/m. If this is out of the aformentioned range listed, please consider follow up with your Primary Care Provider.   It was a pleasure to see you today!  Dr. Loletha Carrow   Food Guidelines for gas and bloating  Many people have difficulty digesting certain foods, causing a variety of distressing and embarrassing symptoms such as abdominal pain, bloating and gas.  These foods may need to be avoided or consumed in small amounts.  Here are some tips that might be helpful for you.  1.   Lactose intolerance is the difficulty or complete inability to digest lactose, the natural sugar in milk and anything made from milk.  This condition is harmless, common, and can begin any time during life.  Some people can digest a modest amount of lactose while others cannot tolerate any.  Also, not all dairy products contain equal amounts of lactose.  For example, hard cheeses such as parmesan have less lactose than soft cheeses such as cheddar.  Yogurt has less lactose than milk or cheese.  Many packaged foods (even many brands of bread) have milk, so read ingredient lists carefully.  It is difficult to test for lactose intolerance, so just try avoiding lactose as much as possible for a week and see what happens with your symptoms.  If you seem to be lactose intolerant, the best plan is to avoid it (but make sure you get calcium from another source).  The next best thing is to use lactase enzyme supplements, available over the counter everywhere.  Just know that many lactose intolerant people need to take several tablets with each serving of dairy to avoid symptoms.  Lastly, a lot of restaurant food is  made with milk or butter.  Many are things you might not suspect, such as mashed potatoes, rice and pasta (cooked with butter) and "grilled" items.  If you are lactose intolerant, it never hurts to ask your server what has milk or butter.  2.   Fiber is an important part of your diet, but not all fiber is well-tolerated.  Insoluble fiber such as bran is often consumed by normal gut bacteria and converted into gas.  Soluble fiber such as oats, squash, carrots and green beans are typically tolerated better.  3.   Some types of carbohydrates can be poorly digested.  Examples include: fructose (apples, cherries, pears, raisins and other dried fruits), fructans (onions, zucchini, large amounts of wheat), sorbitol/mannitol/xylitol and sucralose/Splenda (common artificial sweeteners), and raffinose (lentils, broccoli, cabbage, asparagus, brussel sprouts, many types of beans).  Do a Development worker, community for [employer] and you will find helpful information. Beano, a dietary supplement, will often help with raffinose-containing foods.  As with lactase tablets, you may need several per serving.  4.   Whenever possible, avoid processed food&meats and chemical additives.  High fructose corn syrup, a common sweetener, may be difficult to digest.  Eggs and soy (comes from the soybean, and added to many foods now) are other common bloating/gassy foods.  - Dr. [facility]

## 2019-09-21 ENCOUNTER — Other Ambulatory Visit: Payer: Self-pay | Admitting: Family Medicine

## 2019-09-22 MED ORDER — VYVANSE 50 MG PO CAPS: 50.0000 mg | ORAL_CAPSULE | Freq: Every day | ORAL | 0 refills | Status: DC

## 2019-10-08 ENCOUNTER — Other Ambulatory Visit: Payer: Self-pay | Admitting: Family Medicine

## 2019-10-08 MED ORDER — ESCITALOPRAM OXALATE 10 MG PO TABS: 10.0000 mg | ORAL_TABLET | Freq: Every day | ORAL | 0 refills | Status: DC

## 2019-10-20 ENCOUNTER — Ambulatory Visit (HOSPITAL_COMMUNITY)
Admission: EM | Admit: 2019-10-20 | Discharge: 2019-10-20 | Disposition: A | Discharge status: Home / Self Care | Payer: Managed Care, Other (non HMO) | Attending: Internal Medicine | Admitting: Internal Medicine

## 2019-10-20 ENCOUNTER — Other Ambulatory Visit: Payer: Self-pay

## 2019-10-20 ENCOUNTER — Encounter (HOSPITAL_COMMUNITY): Payer: Self-pay | Admitting: Emergency Medicine

## 2019-10-20 DIAGNOSIS — Z8042 Family history of malignant neoplasm of prostate: Secondary | ICD-10-CM | POA: Insufficient documentation

## 2019-10-20 DIAGNOSIS — J029 Acute pharyngitis, unspecified: Secondary | ICD-10-CM

## 2019-10-20 DIAGNOSIS — Z803 Family history of malignant neoplasm of breast: Secondary | ICD-10-CM | POA: Diagnosis not present

## 2019-10-20 DIAGNOSIS — F3289 Other specified depressive episodes: Secondary | ICD-10-CM | POA: Diagnosis not present

## 2019-10-20 DIAGNOSIS — R5383 Other fatigue: Secondary | ICD-10-CM | POA: Diagnosis not present

## 2019-10-20 DIAGNOSIS — Z79899 Other long term (current) drug therapy: Secondary | ICD-10-CM | POA: Diagnosis not present

## 2019-10-20 DIAGNOSIS — Z880 Allergy status to penicillin: Secondary | ICD-10-CM | POA: Diagnosis not present

## 2019-10-20 DIAGNOSIS — Z87891 Personal history of nicotine dependence: Secondary | ICD-10-CM | POA: Diagnosis not present

## 2019-10-20 DIAGNOSIS — Z8261 Family history of arthritis: Secondary | ICD-10-CM | POA: Diagnosis not present

## 2019-10-20 DIAGNOSIS — Z20822 Contact with and (suspected) exposure to covid-19: Secondary | ICD-10-CM | POA: Diagnosis not present

## 2019-10-20 DIAGNOSIS — B349 Viral infection, unspecified: Secondary | ICD-10-CM | POA: Diagnosis not present

## 2019-10-20 DIAGNOSIS — Z822 Family history of deafness and hearing loss: Secondary | ICD-10-CM | POA: Diagnosis not present

## 2019-10-20 DIAGNOSIS — R519 Headache, unspecified: Secondary | ICD-10-CM | POA: Diagnosis present

## 2019-10-20 LAB — COMPREHENSIVE METABOLIC PANEL
ALT: 19 U/L (ref 0–44)
AST: 20 U/L (ref 15–41)
Albumin: 4.7 g/dL (ref 3.5–5.0)
Alkaline Phosphatase: 43 U/L (ref 38–126)
Anion gap: 11 (ref 5–15)
BUN: 12 mg/dL (ref 6–20)
CO2: 26 mmol/L (ref 22–32)
Calcium: 9.1 mg/dL (ref 8.9–10.3)
Chloride: 101 mmol/L (ref 98–111)
Creatinine, Ser: 0.98 mg/dL (ref 0.44–1.00)
GFR calc Af Amer: >60 mL/min (ref >60)
GFR calc non Af Amer: >60 mL/min (ref >60)
Glucose, Bld: 96 mg/dL (ref 70–99)
Potassium: 4 mmol/L (ref 3.5–5.1)
Sodium: 138 mmol/L (ref 135–145)
Total Bilirubin: 0.9 mg/dL (ref 0.3–1.2)
Total Protein: 7.1 g/dL (ref 6.5–8.1)

## 2019-10-20 LAB — CBC WITH DIFFERENTIAL/PLATELET
Abs Immature Granulocytes: 0.02 10*3/uL (ref 0.00–0.07)
Basophils Absolute: 0.1 10*3/uL (ref 0.0–0.1)
Basophils Relative: 1 %
Eosinophils Absolute: 0.2 10*3/uL (ref 0.0–0.5)
Eosinophils Relative: 3 %
HCT: 43.5 % (ref 36.0–46.0)
Hemoglobin: 14.2 g/dL (ref 12.0–15.0)
Immature Granulocytes: 0 %
Lymphocytes Relative: 30 %
Lymphs Abs: 2.1 10*3/uL (ref 0.7–4.0)
MCH: 31.1 pg (ref 26.0–34.0)
MCHC: 32.6 g/dL (ref 30.0–36.0)
MCV: 95.4 fL (ref 80.0–100.0)
Monocytes Absolute: 0.6 10*3/uL (ref 0.1–1.0)
Monocytes Relative: 8 %
Neutro Abs: 4 10*3/uL (ref 1.7–7.7)
Neutrophils Relative %: 58 %
Platelets: 280 10*3/uL (ref 150–400)
RBC: 4.56 MIL/uL (ref 3.87–5.11)
RDW: 12.3 % (ref 11.5–15.5)
WBC: 6.9 10*3/uL (ref 4.0–10.5)
nRBC: 0 % (ref 0.0–0.2)

## 2019-10-20 LAB — TSH: TSH: 1.683 u[IU]/mL (ref 0.350–4.500)

## 2019-10-20 LAB — VITAMIN D 25 HYDROXY (VIT D DEFICIENCY, FRACTURES): Vit D, 25-Hydroxy: 20.4 ng/mL — ABNORMAL LOW (ref 30–100)

## 2019-10-20 NOTE — Discharge Instructions (Addendum)
We are doing some blood work.  I will call you with any abnormal results. Try taking some allergy medication for your symptoms.  Work note given Covid swab sent for testing Follow up as needed for continued or worsening symptoms Mental health resources given

## 2019-10-20 NOTE — ED Triage Notes (Signed)
Headache and sore throat, "difficult time with word retrieval, decreased cognitive ability " per patient.  Patient is very tired and very fatigue. Patient has had a slight runny nose, no cough.
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
## 2019-10-22 ENCOUNTER — Other Ambulatory Visit: Payer: Self-pay

## 2019-10-22 ENCOUNTER — Telehealth (INDEPENDENT_AMBULATORY_CARE_PROVIDER_SITE_OTHER): Payer: Managed Care, Other (non HMO) | Admitting: Family Medicine

## 2019-10-22 ENCOUNTER — Encounter: Payer: Self-pay | Admitting: Family Medicine

## 2019-10-22 ENCOUNTER — Telehealth: Payer: Managed Care, Other (non HMO) | Admitting: Nurse Practitioner

## 2019-10-22 DIAGNOSIS — J029 Acute pharyngitis, unspecified: Secondary | ICD-10-CM

## 2019-10-22 DIAGNOSIS — R519 Headache, unspecified: Secondary | ICD-10-CM | POA: Diagnosis not present

## 2019-10-22 DIAGNOSIS — R5383 Other fatigue: Secondary | ICD-10-CM

## 2019-10-22 DIAGNOSIS — J069 Acute upper respiratory infection, unspecified: Secondary | ICD-10-CM

## 2019-10-22 DIAGNOSIS — Z8619 Personal history of other infectious and parasitic diseases: Secondary | ICD-10-CM

## 2019-10-22 LAB — LYME, WESTERN BLOT, SERUM (REFLEXED)
IgG P18 Ab.: ABSENT
IgG P23 Ab.: ABSENT
IgG P28 Ab.: ABSENT
IgG P30 Ab.: ABSENT
IgG P39 Ab.: ABSENT
IgG P45 Ab.: ABSENT
IgG P58 Ab.: ABSENT
IgG P66 Ab.: ABSENT
IgG P93 Ab.: ABSENT
IgM P39 Ab.: ABSENT
IgM P41 Ab.: ABSENT
Lyme IgG Wb: NEGATIVE
Lyme IgM Wb: NEGATIVE

## 2019-10-22 LAB — NOVEL CORONAVIRUS, NAA (HOSP ORDER, SEND-OUT TO REF LAB; TAT 18-24 HRS): SARS-CoV-2, NAA: NOT DETECTED

## 2019-10-22 LAB — B. BURGDORFI ANTIBODIES: B burgdorferi Ab IgG+IgM: 1.05 {ISR} — ABNORMAL HIGH (ref 0.00–0.90)

## 2019-10-22 MED ORDER — AZITHROMYCIN 250 MG PO TABS: ORAL_TABLET | ORAL | 0 refills | Status: DC

## 2019-10-22 NOTE — Progress Notes (Signed)
E-Visit for Corona Virus Screening  Your current symptoms could be consistent with the coronavirus.  Many health care providers can now test patients at their office but not all are.  [facility] has multiple testing sites. For information on our [place] testing locations and hours go to [email or web address]  * this sounds like a viral URI, but you need to have covid tetsting to confirm that you do not have covid first.    We are enrolling you in our [place] for [place] . Daily you will receive a questionnaire within the [facility] website. Our COVID 19 response team will be monitoring your responses daily.  Testing Information: The COVID-19 Community Testing sites will begin testing BY APPOINTMENT ONLY.  You can schedule online at [email or web address]  If you do not have access to a smart phone or computer you may call [phone] for an appointment.   Additional testing sites in the Community:  . For [facility] Testing sites in [facility]  FaceUpdate.uy  . For Pop-up testing sites in [place]  BowlDirectory.co.uk  . For Testing sites with regular hours [email or web address]  . For [place] [email or web address]  . For [facility] [email or web address]  . For [facility] testing in [place] and [employer] [email or web address]  . For [facility] testing in [facility]   [email or web address]  For  more information about community testing call [phone]   Please quarantine yourself while awaiting your test results. Please stay home for a minimum of 10 days from the first day  of illness with improving symptoms and you have had 24 hours of no fever (without the use of Tylenol (Acetaminophen) Motrin (Ibuprofen) or any fever reducing medication).  Also - Do not get tested prior to returning to work because once you have had a positive test the test can stay positive for more then a month in some cases.   You should wear a mask or cloth face covering over your nose and mouth if you must be around other people or animals, including pets (even at home). Try to stay at least 6 feet away from other people. This will protect the people around you.  Please continue good preventive care measures, including:  frequent hand-washing, avoid touching your face, cover coughs/sneezes, stay out of crowds and keep a 6 foot distance from others.  COVID-19 is a respiratory illness with symptoms that are similar to the flu. Symptoms are typically mild to moderate, but there have been cases of severe illness and death due to the virus.   The following symptoms may appear 2-14 days after exposure: . Fever . Cough . Shortness of breath or difficulty breathing . Chills . Repeated shaking with chills . Muscle pain . Headache . Sore throat . New loss of taste or smell . Fatigue . Congestion or runny nose . Nausea or vomiting . Diarrhea  Go to the nearest hospital ED for assessment if fever/cough/breathlessness are severe or illness seems like a threat to life.  It is vitally important that if you feel that you have an infection such as this virus or any other virus that you stay home and away from places where you may spread it to others.  You should avoid contact with people age 29 and older.   You can use medication such as delsym if develop a cough  You may also take acetaminophen (Tylenol) as needed for fever.  Reduce your risk of any infection by using the same precautions used for avoiding  the common cold or flu:  Marland Kitchen Wash your hands often with soap and warm water for at least 20  seconds.  If soap and water are not readily available, use an alcohol-based hand sanitizer with at least 60% alcohol.  . If coughing or sneezing, cover your mouth and nose by coughing or sneezing into the elbow areas of your shirt or coat, into a tissue or into your sleeve (not your hands). . Avoid shaking hands with others and consider head nods or verbal greetings only. . Avoid touching your eyes, nose, or mouth with unwashed hands.  . Avoid close contact with people who are sick. . Avoid places or events with large numbers of people in one location, like concerts or sporting events. . Carefully consider travel plans you have or are making. . If you are planning any travel outside or inside the [place], visit the CDC's Travelers' Health webpage for the latest health notices. . If you have some symptoms but not all symptoms, continue to monitor at home and seek medical attention if your symptoms worsen. . If you are having a medical emergency, call 911.  HOME CARE . Only take medications as instructed by your medical team. . Drink plenty of fluids and get plenty of rest. . A steam or ultrasonic humidifier can help if you have congestion.   GET HELP RIGHT AWAY IF YOU HAVE EMERGENCY WARNING SIGNS** FOR COVID-19. If you or someone is showing any of these signs seek emergency medical care immediately. Call 911 or proceed to your closest emergency facility if: . You develop worsening high fever. . Trouble breathing . Bluish lips or face . Persistent pain or pressure in the chest . New confusion . Inability to wake or stay awake . You cough up blood. . Your symptoms become more severe  **This list is not all possible symptoms. Contact your medical provider for any symptoms that are sever or concerning to you.  MAKE SURE YOU   Understand these instructions.  Will watch your condition.  Will get help right away if you are not doing well or get worse.  Your e-visit answers were reviewed by a  board certified advanced clinical practitioner to complete your personal care plan.  Depending on the condition, your plan could have included both over the counter or prescription medications.  If there is a problem please reply once you have received a response from your provider.  Your safety is important to [place].  If you have drug allergies check your prescription carefully.    You can use MyChart to ask questions about today's visit, request a non-urgent call back, or ask for a work or school excuse for 24 hours related to this e-Visit. If it has been greater than 24 hours you will need to follow up with your provider, or enter a new e-Visit to address those concerns. You will get an e-mail in the next two days asking about your experience.  I hope that your e-visit has been valuable and will speed your recovery. Thank you for using e-visits.   5-10 minutes spent reviewing and documenting in chart.

## 2019-10-22 NOTE — Progress Notes (Signed)
Virtual Visit via Video Note  I connected with Yolanda Santana  on 10/23/19 at  2:30 PM EST by a video enabled telemedicine application and verified that I am speaking with the correct person using two identifiers.  Location patient: home Location provider:work or home office Persons participating in the virtual visit: patient, provider  I discussed the limitations of evaluation and management by telemedicine and the availability of in person appointments. The patient expressed understanding and agreed to proceed.   Yolanda Santana DOB: July 27, 1971 Encounter date: 10/22/2019  This is a 49 y.o. female who presents with sore throat   History of present illness: Has been exhausted, sore throat, swollen glands, hurts to swallow, headache, left ear pain persistent, freezing, brain fog; difficulty with thought retrieval at work. Went to urgent care. Has had fluid from left ear. Clear fluid. No odor to this that she has noticed. Hard to swallow food without drink. Has felt food more in esoph than throat.   Really working on vitamin intake. Took allegra and tried flonase. No significant help with this. Very achy. Joints are achy. Sinuses do feel full in last few days frontal and maxillary. Did not have sinus issues before this week. Just can't get enough rest.   Has felt horrible x 3 days, but not well for 5 days. Seeing patients daily as long as they don't have fever so possible exposures.   She hasn't been able to get COVID vaccination because every time she logs in then all appointments are gone.   Has felt a little winded with talking to patients while wearing mask.   Has not had any fevers. Normal temp is around 97 and has been in upper 98's. Has been having some night sweats. Goes to bed freezing and wakes up drenched.   Hasn't felt "fantastic" in a long time, but things were pretty abrupt onset.   Has had more tarry kind of stools. No blood in stools. No nausea.   NO problems with urination.     Allergies  Allergen Reactions  . Penicillins Rash   No outpatient medications have been marked as taking for the 10/22/19 encounter (Telemedicine) with Caren Macadam, MD.    Review of Systems  Constitutional: Positive for fatigue. Negative for chills and fever.  HENT: Positive for congestion, postnasal drip, sinus pressure, sinus pain and sore throat. Negative for ear pain.   Respiratory: Negative for cough, shortness of breath and wheezing.   Cardiovascular: Negative for chest pain.  Neurological: Positive for headaches.  Hematological: Positive for adenopathy.    Objective:  There were no vitals taken for this visit.      BP Readings from Last 3 Encounters:  10/20/19 124/85  08/19/19 104/64  06/13/19 117/73   Wt Readings from Last 3 Encounters:  08/19/19 170 lb 2 oz (77.2 kg)  06/13/19 165 lb (74.8 kg)  03/31/19 173 lb 6.4 oz (78.7 kg)    EXAM:  GENERAL: alert, oriented, appears ill, but in no acute distress.  HEENT: atraumatic, conjunctiva clear, no obvious abnormalities on inspection of external nose and ears  NECK: normal movements of the head and neck  LUNGS: on inspection no signs of respiratory distress, breathing rate appears normal, no obvious gross SOB, gasping or wheezing  CV: no obvious cyanosis  MS: moves all visible extremities without noticeable abnormality  PSYCH/NEURO: pleasant and cooperative, no obvious depression or anxiety, speech and thought processing grossly intact   Assessment/Plan  1. Acute nonintractable headache, unspecified headache type Suspect  related to current illness.  Uncertain if illness is Covid, sinus infection, or other viral.  We discussed with duration of symptoms, that if flu she will no longer benefit from medication.  She is ready had Covid test and we are awaiting results.  Due to ear pain, throat pain with swollen glands, and sinus discomfort, we are to try antibiotic while awaiting this result.  I will be  back in touch with her as soon as I see Covid result.  2. Pharyngitis, unspecified etiology See above.    Return if symptoms worsen or fail to improve.   I discussed the assessment and treatment plan with the patient. The patient was provided an opportunity to ask questions and all were answered. The patient agreed with the plan and demonstrated an understanding of the instructions.   The patient was advised to call back or seek an in-person evaluation if the symptoms worsen or if the condition fails to improve as anticipated.  I provided 20 minutes of non-face-to-face time during this encounter.   Micheline Rough, MD

## 2019-10-23 ENCOUNTER — Other Ambulatory Visit: Payer: Self-pay | Admitting: Family Medicine

## 2019-10-23 MED ORDER — DOXYCYCLINE HYCLATE 100 MG PO TABS: 100.0000 mg | ORAL_TABLET | Freq: Two times a day (BID) | ORAL | 0 refills | Status: AC

## 2019-10-25 ENCOUNTER — Encounter: Payer: Self-pay | Admitting: Family Medicine

## 2019-10-25 NOTE — Telephone Encounter (Signed)
[place] for work note.  Not sure what specialist she is thinking of. Typically more functional medicine docs that work with chronic lyme/fatigue if that is what she was thinking?

## 2019-10-26 ENCOUNTER — Encounter: Payer: Self-pay | Admitting: Family Medicine

## 2019-10-29 ENCOUNTER — Other Ambulatory Visit: Payer: Self-pay | Admitting: Family Medicine

## 2019-10-29 MED ORDER — VYVANSE 50 MG PO CAPS: 50.0000 mg | ORAL_CAPSULE | Freq: Every day | ORAL | 0 refills | Status: DC

## 2019-10-29 NOTE — Telephone Encounter (Signed)
Vivien Rota do you know anything about this? (I am helping Brittani)

## 2019-10-31 NOTE — Telephone Encounter (Signed)
Yolanda Santana - can you set up visit for breast exam? OK to add to end of Monday if able OR could add on during my lunch another day for in office.   [name] - I put in referral for her for [facility]; although listed as fam med, she is functional med doc. She has appointment with dr. Lily Kocher this week on 3/10; can you forward recent labwork to her? Practice is in [place] ([place] [address]) [phone]

## 2019-11-12 ENCOUNTER — Other Ambulatory Visit: Payer: Self-pay

## 2019-11-12 ENCOUNTER — Ambulatory Visit (INDEPENDENT_AMBULATORY_CARE_PROVIDER_SITE_OTHER): Payer: Managed Care, Other (non HMO) | Admitting: Family Medicine

## 2019-11-12 ENCOUNTER — Encounter: Payer: Self-pay | Admitting: Family Medicine

## 2019-11-12 VITALS — BP 98/58 | HR 94 | Temp 97.7°F | Ht 65.5 in | Wt 169.8 lb

## 2019-11-12 DIAGNOSIS — N63 Unspecified lump in unspecified breast: Secondary | ICD-10-CM

## 2019-11-12 NOTE — Progress Notes (Signed)
Patient's appointment was not entered onto the schedule, so she had to be double booked upon her arrival.  When provider entered the room, patient had already redressed and declined exam.  She was upset with having to wait for provider, but mostly upset with previous interactions with the office, especially during the time of Covid.  She is frustrated with the amount of medical bills that she has, and the fact that she was not able to be seen in the office with having sick symptoms (since she had a negative Covid test).  She has had multiple sick symptoms in the last year and required evaluations from specialist as well as urgent care to assess her complaints.  Due to patient being upset and expressing her frustrations, provider was not able to engage in discussion.  I did offer to help facilitate breast evaluation since the reason for the visit today was due to a new breast lump.  She states she will schedule a visit elsewhere, but I did let her know that I am happy to order an ultrasound for further evaluation considering that she had a normal mammogram in the last year.  Her husband was also at the appointment and I further relayed this information to him.  He states that she is now following with a specialist due to chronic Lyme, which he feels is the reason for her multiple health concerns within this last year.  Patient left without exam. Concerns were relayed to office manager. Provider instructed front desk to return patient copay since she did not have a visit.

## 2019-11-18 ENCOUNTER — Encounter: Payer: Self-pay | Admitting: Family Medicine

## 2019-11-18 DIAGNOSIS — R922 Inconclusive mammogram: Secondary | ICD-10-CM

## 2019-11-18 DIAGNOSIS — N6019 Diffuse cystic mastopathy of unspecified breast: Secondary | ICD-10-CM

## 2019-11-18 DIAGNOSIS — N63 Unspecified lump in unspecified breast: Secondary | ICD-10-CM

## 2019-11-18 NOTE — Telephone Encounter (Signed)
Can you please confirm which side breast has the lump? She left without evaluation, and I cannot find note in chart where I see it.    I pended order for left breast, but please correct if needed and please also give her number to call and schedule this.

## 2019-11-19 ENCOUNTER — Encounter: Payer: Self-pay | Admitting: Family Medicine

## 2019-11-19 NOTE — Telephone Encounter (Signed)
I am having hard time placing order for diagnostic US in our system. Can you address question I sent her regarding any palpable abnormalities in the left breast? I am sure that insurance would cover diagnostic US and likely mammogram of the right breast where she has lump, but with normal left mammogram  Previously, I am not sure if they will cover diagnostic US/mammogram of left. If she is ok with ordering regardless, please order for her so this can be completed. Typically if abnormality is just palpated on one side we would just get diagnostic on that side.

## 2019-11-19 NOTE — Telephone Encounter (Signed)
Ok. I have changed order to bilateral. With last mammogram being read as normal, I am not sure about any potential issues with coverage for the ultrasounds for the left breast (without the lump), but right should be covered. I know her coverage here has been different. With family history it would also be reasonable for her to follow with breast specialist there as well.   It would be worthwhile making sure no adjustments to order needed with [facility] before end of day so that this doesn't have to be addressed by different provider next week.

## 2019-11-22 ENCOUNTER — Other Ambulatory Visit: Payer: Self-pay | Admitting: Family Medicine

## 2019-11-22 DIAGNOSIS — N63 Unspecified lump in unspecified breast: Secondary | ICD-10-CM

## 2019-12-08 ENCOUNTER — Other Ambulatory Visit: Payer: Self-pay

## 2019-12-08 ENCOUNTER — Ambulatory Visit
Admission: RE | Admit: 2019-12-08 | Discharge: 2019-12-08 | Disposition: A | Discharge status: Home / Self Care | Payer: Managed Care, Other (non HMO) | Source: Ambulatory Visit | Attending: Family Medicine | Admitting: Family Medicine

## 2019-12-08 DIAGNOSIS — R922 Inconclusive mammogram: Secondary | ICD-10-CM

## 2019-12-08 DIAGNOSIS — N63 Unspecified lump in unspecified breast: Secondary | ICD-10-CM

## 2019-12-08 DIAGNOSIS — N6019 Diffuse cystic mastopathy of unspecified breast: Secondary | ICD-10-CM

## 2019-12-11 ENCOUNTER — Other Ambulatory Visit: Payer: Self-pay | Admitting: Family Medicine

## 2019-12-13 MED ORDER — VYVANSE 50 MG PO CAPS: 50.0000 mg | ORAL_CAPSULE | Freq: Every day | ORAL | 0 refills | Status: DC

## 2020-01-06 ENCOUNTER — Other Ambulatory Visit: Payer: Self-pay | Admitting: Family Medicine

## 2020-01-07 ENCOUNTER — Other Ambulatory Visit: Payer: Self-pay | Admitting: Family Medicine

## 2020-01-07 MED ORDER — ESCITALOPRAM OXALATE 10 MG PO TABS: 10.0000 mg | ORAL_TABLET | Freq: Every day | ORAL | 0 refills | Status: DC

## 2020-01-07 NOTE — Telephone Encounter (Signed)
Forwarding to PCP CMA

## 2020-01-10 NOTE — Telephone Encounter (Signed)
Message routed to PCP CMA  

## 2020-01-11 ENCOUNTER — Other Ambulatory Visit: Payer: Self-pay | Admitting: Family Medicine

## 2020-01-11 MED ORDER — VYVANSE 50 MG PO CAPS: 50.0000 mg | ORAL_CAPSULE | Freq: Every day | ORAL | 0 refills | Status: DC

## 2020-01-11 NOTE — Telephone Encounter (Signed)
sent 

## 2020-01-12 NOTE — Telephone Encounter (Signed)
Noted  

## 2020-03-01 ENCOUNTER — Other Ambulatory Visit: Payer: Self-pay | Admitting: Family Medicine

## 2020-03-01 DIAGNOSIS — Z1231 Encounter for screening mammogram for malignant neoplasm of breast: Secondary | ICD-10-CM

## 2020-03-27 ENCOUNTER — Other Ambulatory Visit: Payer: Self-pay | Admitting: Family Medicine

## 2020-03-27 NOTE — Telephone Encounter (Signed)
Message routed to PCP CMA  

## 2020-03-28 ENCOUNTER — Telehealth: Payer: Self-pay | Admitting: Family Medicine

## 2020-03-29 MED ORDER — ESCITALOPRAM OXALATE 10 MG PO TABS: 10.0000 mg | ORAL_TABLET | Freq: Every day | ORAL | 0 refills | Status: DC

## 2020-03-29 MED ORDER — VYVANSE 50 MG PO CAPS: 50.0000 mg | ORAL_CAPSULE | Freq: Every day | ORAL | 0 refills | Status: DC

## 2020-03-29 NOTE — Telephone Encounter (Signed)
At last visit she said she was going to switch providers/office. If she is staying with [place], she should schedule follow up. If she is going to transfer, I have given 90 days of above medications and cannot refill again without visit (just heads up for her)

## 2020-04-14 ENCOUNTER — Encounter: Payer: Self-pay | Admitting: Family Medicine

## 2020-04-14 ENCOUNTER — Telehealth: Payer: Self-pay | Admitting: Family Medicine

## 2020-04-14 NOTE — Telephone Encounter (Signed)
PA started and marked as urgent. Key [id number] and PA Case ID [id number]. Can call [employer] at [phone]. Husband also notified. Please check on Monday where the process is and notify husband.

## 2020-04-14 NOTE — Telephone Encounter (Signed)
Called patient; she has zpack at home. Advised to start taking this. Ok to continue Human resources officer D which is helping with symptoms.  Patient states that she has had sinus congestion for a couple of weeks.  She has had some ear pressure and pain for few weeks, but worse in the last couple of days.  She states that someone work looked in her ear today and thought that there might be some fluid in there.  Allegra-D is helping with congestion and pressure in the ear.  She has had no fevers.  She has had some clear drainage from the ears on pillow at night.  Left ear is worse.  Message sent for her to respond to is not getting improvement with the Z-Pak.

## 2020-04-14 NOTE — Telephone Encounter (Signed)
Pt is scheduled for an appt on Monday with Dr. Sarajane Jews regarding her L ear ache. She is trying to be seen today because it is unbearable however all PCP are booked and she is trying to avoid the Urgent care due to the high copay. She is wondering if her PCP will fit her in?   Pt can be reached at [phone]

## 2020-04-14 NOTE — Telephone Encounter (Signed)
The patient's husband called to get a PA approved for the patients medication. She only has 3-4 days left.  Please advise

## 2020-04-17 ENCOUNTER — Ambulatory Visit: Payer: Managed Care, Other (non HMO) | Admitting: Family Medicine

## 2020-04-17 ENCOUNTER — Other Ambulatory Visit: Payer: Self-pay

## 2020-04-17 ENCOUNTER — Encounter: Payer: Self-pay | Admitting: Family Medicine

## 2020-04-17 VITALS — BP 124/82 | HR 76 | Temp 99.0°F | Wt 165.8 lb

## 2020-04-17 DIAGNOSIS — H9202 Otalgia, left ear: Secondary | ICD-10-CM

## 2020-04-17 MED ORDER — AZELASTINE HCL 0.1 % NA SOLN: 2.0000 | Freq: Two times a day (BID) | NASAL | 0 refills | Status: DC

## 2020-04-17 MED ORDER — LEVOFLOXACIN 500 MG PO TABS
500.0000 mg | ORAL_TABLET | Freq: Every day | ORAL | 0 refills | Status: AC
Start: 2020-04-17 — End: 2020-04-27

## 2020-04-17 NOTE — Progress Notes (Signed)
   Subjective:    Patient ID: Yolanda Santana, female    DOB: [birth year], 49 y.o.   MRN: [id number]  HPI Here for continued pressure and pain in the left ear with occasional dizziness. She has a hx of sinus and ear issues. She has had a rhinoseptoplasty. She used to use Flonase daily but she is concerned about long term steroid use. She spoke to Dr. Ethlyn Gallery last week and she was started on a Zpack. This has not helped and she asks for a stronger antibiotic. She actually has an appt to see [facility] on Sept. 9. No fever or ST or cough.    Review of Systems  Constitutional: Negative.   HENT: Positive for congestion, ear pain and sinus pressure. Negative for postnasal drip and sore throat.   Eyes: Negative.   Respiratory: Negative.   Neurological: Positive for dizziness. Negative for headaches.       Objective:   Physical Exam Constitutional:      Appearance: Normal appearance. She is not ill-appearing.  HENT:     Right Ear: Tympanic membrane, ear canal and external ear normal.     Left Ear: Ear canal and external ear normal.     Ears:     Comments: Left TM is slightly retracted     Mouth/Throat:     Pharynx: Oropharynx is clear.  Eyes:     Conjunctiva/sclera: Conjunctivae normal.  Pulmonary:     Effort: Pulmonary effort is normal.     Breath sounds: Normal breath sounds.  Lymphadenopathy:     Cervical: No cervical adenopathy.  Neurological:     Mental Status: She is alert.           Assessment & Plan:  Sinusitis with eustachian tube dysfunction. We will stop the Zpack and get on Levaquin. She can try Astelin nasal spray. Follow up with ENT as noted above.  Alysia Penna, MD

## 2020-04-18 NOTE — Telephone Encounter (Signed)
Fax received from [place] stating the request was approved from 04/17/2020-04/17/2021.  I spoke with the pts husband and informed him of this.  Fax sent to be scanned.

## 2020-04-19 ENCOUNTER — Ambulatory Visit: Payer: Managed Care, Other (non HMO)

## 2020-04-21 ENCOUNTER — Ambulatory Visit: Payer: Managed Care, Other (non HMO)

## 2020-04-28 ENCOUNTER — Other Ambulatory Visit: Payer: Self-pay

## 2020-04-28 ENCOUNTER — Ambulatory Visit
Admission: RE | Admit: 2020-04-28 | Discharge: 2020-04-28 | Disposition: A | Discharge status: Home / Self Care | Payer: Managed Care, Other (non HMO) | Source: Ambulatory Visit | Attending: Family Medicine | Admitting: Family Medicine

## 2020-04-28 DIAGNOSIS — Z1231 Encounter for screening mammogram for malignant neoplasm of breast: Secondary | ICD-10-CM

## 2020-05-05 DIAGNOSIS — L299 Pruritus, unspecified: Secondary | ICD-10-CM | POA: Insufficient documentation

## 2020-05-05 DIAGNOSIS — H9042 Sensorineural hearing loss, unilateral, left ear, with unrestricted hearing on the contralateral side: Secondary | ICD-10-CM | POA: Insufficient documentation

## 2020-05-05 DIAGNOSIS — M26609 Unspecified temporomandibular joint disorder, unspecified side: Secondary | ICD-10-CM | POA: Insufficient documentation

## 2020-05-05 DIAGNOSIS — M542 Cervicalgia: Secondary | ICD-10-CM | POA: Insufficient documentation

## 2020-05-16 MED ORDER — ALPRAZOLAM 0.5 MG PO TABS
0.5000 mg | ORAL_TABLET | Freq: Every day | ORAL | 0 refills | Status: DC | PRN
Start: 2020-05-16 — End: 2020-07-18

## 2020-05-24 ENCOUNTER — Encounter: Payer: Self-pay | Admitting: Physician Assistant

## 2020-05-24 ENCOUNTER — Other Ambulatory Visit (INDEPENDENT_AMBULATORY_CARE_PROVIDER_SITE_OTHER): Payer: Managed Care, Other (non HMO)

## 2020-05-24 ENCOUNTER — Ambulatory Visit (INDEPENDENT_AMBULATORY_CARE_PROVIDER_SITE_OTHER): Payer: Managed Care, Other (non HMO) | Admitting: Physician Assistant

## 2020-05-24 VITALS — BP 120/78 | HR 100 | Ht 65.5 in | Wt 162.0 lb

## 2020-05-24 DIAGNOSIS — R1310 Dysphagia, unspecified: Secondary | ICD-10-CM

## 2020-05-24 DIAGNOSIS — R140 Abdominal distension (gaseous): Secondary | ICD-10-CM | POA: Diagnosis not present

## 2020-05-24 DIAGNOSIS — R197 Diarrhea, unspecified: Secondary | ICD-10-CM

## 2020-05-24 DIAGNOSIS — K589 Irritable bowel syndrome without diarrhea: Secondary | ICD-10-CM

## 2020-05-24 DIAGNOSIS — R143 Flatulence: Secondary | ICD-10-CM | POA: Diagnosis not present

## 2020-05-24 DIAGNOSIS — R1084 Generalized abdominal pain: Secondary | ICD-10-CM | POA: Diagnosis not present

## 2020-05-24 LAB — C-REACTIVE PROTEIN: CRP: <1 mg/dL (ref 0.5–20.0)

## 2020-05-24 LAB — IGA: IgA: 119 mg/dL (ref 68–378)

## 2020-05-24 LAB — SEDIMENTATION RATE: Sed Rate: 14 mm/hr (ref 0–30)

## 2020-05-24 MED ORDER — PLENVU 140 G PO SOLR: 1.0000 | ORAL | 0 refills | Status: DC

## 2020-05-24 MED ORDER — GLYCOPYRROLATE 2 MG PO TABS: 2.0000 mg | ORAL_TABLET | Freq: Two times a day (BID) | ORAL | 6 refills | Status: DC

## 2020-05-24 NOTE — Progress Notes (Signed)
Subjective:    Patient ID: Yolanda Santana, female    DOB: [birth year], 49 y.o.   MRN: [id number]  HPI Yolanda Santana is a pleasant 49 year old white female, established with Dr. Loletha Carrow who had been seen once previously in December 2020.  At that time she had recently relocated from [place].  She has long history of IBS type symptoms with alternating diarrhea and constipation. She had undergone colonoscopy in 2013 in [place] which was normal exam, biopsies for microscopic colitis were negative.  She had EGD in August 2019 showing mild gastritis biopsies negative for H. pylori. She comes in today with progression of symptoms.  She says she has been taking a daily probiotic fairly long-term, which she thinks helps her symptoms somewhat, she has also been taking an additional capsule when she has episodes of diarrhea.  She describes ongoing upper abdominal pressure and discomfort.  She also is describing solid food with dysphagia with a sensation of food sticking in her distal esophagus frequently.  She is having to drink a lot of water to push the food down.  Describes intermittent heartburn and indigestion. At baseline she tends to have loose stools on a daily basis and is having episodes of acute attacks" of diarrhea which generally will occur a few times per month.  With these episodes she will have multiple diarrheal stools and symptoms may last for a day or so.  She very rarely has mild constipation which may occur 1 time per month or less.  She has not been seeing any melena or hematochezia.  She says her abdomen is always uncomfortable and she never feels good. Patient is not sure whether she had been tested for celiac disease in the past, she does have a niece with celiac disease. She does feel that she has some lactose intolerance, rarely drinks milk or eats ice cream but does eat cheese etc.  No known specific food triggers. No artificial sweeteners.  She has been consuming quite a bit of seltzer because  it seems to help her dysphagia symptoms. Patient is requesting further work-up because of worsening of all of her symptoms.  Review of Systems Pertinent positive and negative review of systems were noted in the above HPI section.  All other review of systems was otherwise negative.  Outpatient Encounter Medications as of 05/24/2020  Medication Sig  . ALPRAZolam (XANAX) 0.5 MG tablet Take 1 tablet (0.5 mg total) by mouth daily as needed for anxiety.  Marland Kitchen azelastine (ASTELIN) 0.1 % nasal spray Place 2 sprays into both nostrils 2 (two) times daily. Use in each nostril as directed  . Cholecalciferol (VITAMIN D-3 PO) Take 5,000 Units by mouth every other day.  . escitalopram (LEXAPRO) 10 MG tablet Take 1 tablet (10 mg total) by mouth daily.  . NON FORMULARY Number of herbs and supplements  . OVER THE COUNTER MEDICATION Natures Life supplement-no iron-once a day  . [place]ia Life total min iron free daily  . valACYclovir (VALTREX) 1000 MG tablet Take 2,000 mg by mouth as needed.   Marland Kitchen VYVANSE 50 MG capsule Take 1 capsule (50 mg total) by mouth daily.  Marland Kitchen glycopyrrolate (ROBINUL) 2 MG tablet Take 1 tablet (2 mg total) by mouth 2 (two) times daily.  Marland Kitchen PEG-KCl-NaCl-NaSulf-Na Asc-C (PLENVU) 140 g SOLR Take 1 kit by mouth as directed.  . [DISCONTINUED] amphetamine-dextroamphetamine (ADDERALL) 20 MG tablet Take 20 mg by mouth as needed.   . [DISCONTINUED] buPROPion (WELLBUTRIN SR) 150 MG 12 hr tablet  Start with 1 tab daily x 3 days, then increase to BID   No facility-administered encounter medications on file as of 05/24/2020.   Allergies  Allergen Reactions  . Penicillins Rash   Patient Active Problem List   Diagnosis Date Noted  . Depression, major, single episode, moderate ([place]) 01/08/2019  . ADD (attention deficit disorder) 01/08/2019  . History of Hashimoto thyroiditis 01/08/2019  . Abnormality of breast on screening mammography 01/08/2019  . Recurrent cold sores   .  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 Marital status: Married    Spouse name: Not on file  . Number of children: 2  . Years of education: Not on file  . Highest education level: Not on file  Occupational History  . Occupation: CMA  Tobacco Use  . Smoking status: Former Smoker    Types: Cigarettes    Quit date: 08/18/2012    Years since quitting: 7.7  . Smokeless tobacco: Never Used  Vaping Use  . Vaping Use: Some days  Substance and Sexual Activity  . Alcohol use: Yes    Alcohol/week: 18.0 standard drinks    Types: 18 Glasses of wine per week    Comment: twice a week  . Drug use: Not Currently  . Sexual activity: Yes    Partners: Male  Other Topics Concern  . Not on file  Social History Narrative  . Not on file   Social Determinants of Health   Financial Resource Strain:   . Difficulty of Paying Living Expenses: Not on file  Food Insecurity:   . Worried About Charity fundraiser in the Last Year: Not on file  . Ran Out of Food in the Last Year: Not on file  Transportation Needs:   . Lack of Transportation (Medical): Not on file  . Lack of Transportation (Non-Medical): Not on file  Physical Activity:   . Days of Exercise per Week: Not on file  . Minutes of Exercise per Session: Not on file  Stress:   . Feeling of Stress : Not on file  Social Connections:   . Frequency of Communication with Friends and Family: Not on file  . Frequency of Social Gatherings with Friends and Family: Not on file  . Attends Religious Services: Not on file  . Active Member of Clubs or Organizations: Not on file  . Attends Archivist Meetings: Not on file  . Marital Status: Not on file  Intimate Partner Violence:   . Fear of Current or Ex-Partner: Not on file  . Emotionally Abused: Not on file  . Physically Abused: Not on file  . Sexually Abused: Not on file    Ms. Wurm's family history includes Arthritis in her mother; Breast cancer  (age of onset: 72) in her mother; Hearing loss in her mother; Prostate cancer in her father.      Objective:    Vitals:   05/24/20 1340  BP: 120/78  Pulse: 100    Physical Exam Well-developed well-nourished white female in no acute distress.  Accompanied by her husband height, Weight, 162 BMI 26.5  HEENT; nontraumatic normocephalic, EOMI, PE R R LA, sclera anicteric. Oropharynx; not examined Neck; supple, no JVD Cardiovascular; regular rate and rhythm with S1-S2, no murmur rub or gallop Pulmonary; Clear bilaterally Abdomen; soft, mild bilateral mid and lower abdominal tenderness no guarding no rebound ,nondistended, no palpable mass or hepatosplenomegaly, bowel sounds are active Rectal; not done today Skin; benign exam, no jaundice  rash or appreciable lesions Extremities; no clubbing cyanosis or edema skin warm and dry Neuro/Psych; alert and oriented x4, grossly nonfocal mood and affect appropriate       Assessment & Plan:   #17 49 year old white female with history of IBS, alternating bowel habits in the past.  Now with progression of symptoms over the past year or so with ongoing abdominal discomfort, persistent loose stools and episodes of diarrhea occurring at least a few times a month and lasting for up to 24 hours. Persistent bloating and gas and upper abdominal and lower abdominal discomfort.  Symptoms may all be secondary to IBS-D, rule out component of SIBO, rule out lactose intolerance, rule out celiac disease, no evidence for IBD at the time of prior colonoscopy though this was multiple years ago.  #2 solid food dysphagia-rule out peptic stricture, rule out motility disorder #3 history of thyroiditis 4.  ADD 5.  History of depression/anxiety #6 colon cancer screening-negative colonoscopy 2013 done in [place]; sed rate, CRP, TTG and IgA We will proceed with breath testing for SIBO and lactose intolerance Start trial of glycopyrrolate 2 mg p.o. every  morning, may take twice daily as needed Patient will be scheduled for upper endoscopy with possible dilation and colonoscopy with Dr. Loletha Carrow.  Both procedures were discussed in detail with patient including indications risks and benefits and she is agreeable to proceed. Patient has completed COVID-19 vaccination.  Plan  Raad Clayson Genia Harold PA-C 05/24/2020   Cc: Caren Macadam, MD

## 2020-05-24 NOTE — Patient Instructions (Signed)
If you are age 50 or older, your body mass index should be between 23-30. Your Body mass index is 26.55 kg/m. If this is out of the aforementioned range listed, please consider follow up with your Primary Care Provider.  If you are age 12 or younger, your body mass index should be between 19-25. Your Body mass index is 26.55 kg/m. If this is out of the aformentioned range listed, please consider follow up with your Primary Care Provider.   Your provider has requested that you go to the basement level for lab work before leaving today. Press "B" on the elevator. The lab is located at the first door on the left as you exit the elevator.  You have been scheduled for an endoscopy and colonoscopy. Please follow the written instructions given to you at your visit today. Please pick up your prep supplies at the pharmacy within the next 1-3 days. If you use inhalers (even only as needed), please bring them with you on the day of your procedure.  Due to recent changes in healthcare laws, you may see the results of your imaging and laboratory studies on MyChart before your provider has had a chance to review them.  We understand that in some cases there may be results that are confusing or concerning to you. Not all laboratory results come back in the same time frame and the provider may be waiting for multiple results in order to interpret others.  Please give [place] 48 hours in order for your provider to thoroughly review all the results before contacting the office for clarification of your results.   START Glycopyrrolate 2 mg 1 tablet in the morning, increase to twice daily if needed.  You have been given a testing kit to check for small intestine bacterial overgrowth (SIBO) which is completed by a company named [facility]. Make sure to return your test in the mail using the return mailing label given to you along with the kit. Your demographic and insurance information have already been sent to the  company and they should be in contact with you over the next week regarding this test. [facility] will collect an upfront charge of $99.74 for commercial insurance plans and $209.74 is you are paying cash. Make sure to discuss with [facility] PRIOR to having the test if they have gotten informatoin from your insurance company as to how much your testing will cost out of pocket, if any. Please keep in mind that you will be getting a call from phone number [phone] or a similar number. If you do not hear from them within this time frame, please call our office at [phone].   Follow up pending the results of your Colonoscopy/Endoscopy.

## 2020-05-24 NOTE — Progress Notes (Signed)
____________________________________________________________  Attending physician addendum:  Thank you for sending this case to me. I have reviewed the entire note, and the outlined plan seems appropriate.  We will have to see how she tolerates the medicine since she has a history of alternating bowel habits with constipation that has been severe at times.  Wilfrid Lund, MD  ____________________________________________________________

## 2020-05-26 LAB — TISSUE TRANSGLUTAMINASE, IGA: (tTG) Ab, IgA: <1 U/mL

## 2020-06-16 ENCOUNTER — Ambulatory Visit (INDEPENDENT_AMBULATORY_CARE_PROVIDER_SITE_OTHER): Payer: Managed Care, Other (non HMO) | Admitting: Family Medicine

## 2020-06-16 ENCOUNTER — Other Ambulatory Visit: Payer: Self-pay

## 2020-06-16 ENCOUNTER — Ambulatory Visit (INDEPENDENT_AMBULATORY_CARE_PROVIDER_SITE_OTHER): Payer: Managed Care, Other (non HMO)

## 2020-06-16 ENCOUNTER — Other Ambulatory Visit (HOSPITAL_COMMUNITY)
Admission: RE | Admit: 2020-06-16 | Discharge: 2020-06-16 | Disposition: A | Discharge status: Home / Self Care | Payer: Managed Care, Other (non HMO) | Source: Ambulatory Visit | Attending: Family Medicine | Admitting: Family Medicine

## 2020-06-16 ENCOUNTER — Encounter: Payer: Self-pay | Admitting: Family Medicine

## 2020-06-16 VITALS — BP 100/70 | HR 79 | Temp 98.3°F | Ht 65.25 in | Wt 164.1 lb

## 2020-06-16 DIAGNOSIS — M79675 Pain in left toe(s): Secondary | ICD-10-CM | POA: Diagnosis not present

## 2020-06-16 DIAGNOSIS — R079 Chest pain, unspecified: Secondary | ICD-10-CM

## 2020-06-16 DIAGNOSIS — N898 Other specified noninflammatory disorders of vagina: Secondary | ICD-10-CM | POA: Diagnosis not present

## 2020-06-16 DIAGNOSIS — R5383 Other fatigue: Secondary | ICD-10-CM

## 2020-06-16 DIAGNOSIS — M549 Dorsalgia, unspecified: Secondary | ICD-10-CM

## 2020-06-16 DIAGNOSIS — M542 Cervicalgia: Secondary | ICD-10-CM

## 2020-06-16 DIAGNOSIS — Z0000 Encounter for general adult medical examination without abnormal findings: Secondary | ICD-10-CM | POA: Diagnosis not present

## 2020-06-16 DIAGNOSIS — E559 Vitamin D deficiency, unspecified: Secondary | ICD-10-CM

## 2020-06-16 DIAGNOSIS — F419 Anxiety disorder, unspecified: Secondary | ICD-10-CM

## 2020-06-16 DIAGNOSIS — R002 Palpitations: Secondary | ICD-10-CM | POA: Diagnosis not present

## 2020-06-16 DIAGNOSIS — M2550 Pain in unspecified joint: Secondary | ICD-10-CM

## 2020-06-16 DIAGNOSIS — I83813 Varicose veins of bilateral lower extremities with pain: Secondary | ICD-10-CM

## 2020-06-16 DIAGNOSIS — R011 Cardiac murmur, unspecified: Secondary | ICD-10-CM

## 2020-06-16 DIAGNOSIS — Z13220 Encounter for screening for lipoid disorders: Secondary | ICD-10-CM

## 2020-06-16 DIAGNOSIS — R4589 Other symptoms and signs involving emotional state: Secondary | ICD-10-CM
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
## 2020-06-19 LAB — CBC WITH DIFFERENTIAL/PLATELET
Absolute Monocytes: 423 cells/uL (ref 200–950)
Basophils Absolute: 58 cells/uL (ref 0–200)
Basophils Relative: 1 %
Eosinophils Absolute: 244 cells/uL (ref 15–500)
Eosinophils Relative: 4.2 %
HCT: 41.7 % (ref 35.0–45.0)
Hemoglobin: 13.5 g/dL (ref 11.7–15.5)
Lymphs Abs: 1821 cells/uL (ref 850–3900)
MCH: 31 pg (ref 27.0–33.0)
MCHC: 32.4 g/dL (ref 32.0–36.0)
MCV: 95.6 fL (ref 80.0–100.0)
MPV: 10.1 fL (ref 7.5–12.5)
Monocytes Relative: 7.3 %
Neutro Abs: 3254 cells/uL (ref 1500–7800)
Neutrophils Relative %: 56.1 %
Platelets: 269 10*3/uL (ref 140–400)
RBC: 4.36 10*6/uL (ref 3.80–5.10)
RDW: 11.7 % (ref 11.0–15.0)
Total Lymphocyte: 31.4 %
WBC: 5.8 10*3/uL (ref 3.8–10.8)

## 2020-06-19 LAB — CERVICOVAGINAL ANCILLARY ONLY
Bacterial Vaginitis (gardnerella): NEGATIVE
Candida Glabrata: NEGATIVE
Candida Vaginitis: NEGATIVE
Chlamydia: NEGATIVE
Comment: NEGATIVE
Comment: NEGATIVE
Comment: NEGATIVE
Comment: NEGATIVE
Comment: NEGATIVE
Comment: NORMAL
Neisseria Gonorrhea: NEGATIVE
Trichomonas: NEGATIVE

## 2020-06-19 LAB — COMPREHENSIVE METABOLIC PANEL
AG Ratio: 2.1 (calc) (ref 1.0–2.5)
ALT: 11 U/L (ref 6–29)
AST: 15 U/L (ref 10–35)
Albumin: 4.4 g/dL (ref 3.6–5.1)
Alkaline phosphatase (APISO): 38 U/L (ref 37–153)
BUN: 8 mg/dL (ref 7–25)
CO2: 30 mmol/L (ref 20–32)
Calcium: 9.6 mg/dL (ref 8.6–10.4)
Chloride: 104 mmol/L (ref 98–110)
Creat: 0.73 mg/dL (ref 0.50–1.05)
Globulin: 2.1 g/dL (calc) (ref 1.9–3.7)
Glucose, Bld: 86 mg/dL (ref 65–99)
Potassium: 4.4 mmol/L (ref 3.5–5.3)
Sodium: 139 mmol/L (ref 135–146)
Total Bilirubin: 0.7 mg/dL (ref 0.2–1.2)
Total Protein: 6.5 g/dL (ref 6.1–8.1)

## 2020-06-19 LAB — LIPID PANEL
Cholesterol: 149 mg/dL (ref <200)
HDL: 82 mg/dL (ref >=50)
LDL Cholesterol (Calc): 56 mg/dL (calc)
Non-HDL Cholesterol (Calc): 67 mg/dL (calc) (ref <130)
Total CHOL/HDL Ratio: 1.8 (calc) (ref <5.0)
Triglycerides: 38 mg/dL (ref <150)

## 2020-06-19 LAB — TSH: TSH: 0.74 mIU/L

## 2020-06-19 LAB — T4, FREE: Free T4: 1.1 ng/dL (ref 0.8–1.8)

## 2020-06-19 LAB — VITAMIN D 25 HYDROXY (VIT D DEFICIENCY, FRACTURES): Vit D, 25-Hydroxy: 45 ng/mL (ref 30–100)

## 2020-06-19 LAB — ANA: Anti Nuclear Antibody (ANA): NEGATIVE

## 2020-06-19 LAB — URIC ACID: Uric Acid, Serum: 3.2 mg/dL (ref 2.5–7.0)
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
## 2020-06-21 ENCOUNTER — Ambulatory Visit: Payer: Managed Care, Other (non HMO) | Admitting: Family Medicine

## 2020-06-23 ENCOUNTER — Other Ambulatory Visit (INDEPENDENT_AMBULATORY_CARE_PROVIDER_SITE_OTHER): Payer: Managed Care, Other (non HMO)

## 2020-06-23 DIAGNOSIS — R002 Palpitations: Secondary | ICD-10-CM | POA: Diagnosis not present

## 2020-07-05 ENCOUNTER — Ambulatory Visit (AMBULATORY_SURGERY_CENTER): Payer: Managed Care, Other (non HMO) | Admitting: Gastroenterology

## 2020-07-05 ENCOUNTER — Other Ambulatory Visit: Payer: Self-pay

## 2020-07-05 ENCOUNTER — Encounter: Payer: Self-pay | Admitting: Gastroenterology

## 2020-07-05 VITALS — BP 115/68 | HR 75 | Temp 98.4°F | Resp 11 | Ht 65.5 in | Wt 162.0 lb

## 2020-07-05 DIAGNOSIS — R1310 Dysphagia, unspecified: Secondary | ICD-10-CM

## 2020-07-05 DIAGNOSIS — K529 Noninfective gastroenteritis and colitis, unspecified: Secondary | ICD-10-CM

## 2020-07-05 DIAGNOSIS — R197 Diarrhea, unspecified: Secondary | ICD-10-CM

## 2020-07-05 DIAGNOSIS — R1084 Generalized abdominal pain: Secondary | ICD-10-CM

## 2020-07-05 MED ORDER — SODIUM CHLORIDE 0.9 % IV SOLN: 500.0000 mL | Freq: Once | INTRAVENOUS | Status: DC

## 2020-07-05 NOTE — Progress Notes (Signed)
Called to room to assist during endoscopic procedure.  Patient ID and intended procedure confirmed with present staff. Received instructions for my participation in the procedure from the performing physician.  

## 2020-07-05 NOTE — Op Note (Addendum)
[place] Patient Name: Yolanda Santana Procedure Date: 07/05/2020 4:03 PM MRN: [id number] Endoscopist: [place]. Loletha Carrow , MD Age: 49 Referring MD:  Date of Birth: 1970-09-27 Gender: Female Account #: [id number] Procedure:                Upper GI endoscopy Indications:              Generalized abdominal pain, Esophageal dysphagia,                            Diarrhea Medicines:                Monitored Anesthesia Care Procedure:                Pre-Anesthesia Assessment:                           - Prior to the procedure, a History and Physical                            was performed, and patient medications and                            allergies were reviewed. The patient's tolerance of                            previous anesthesia was also reviewed. The risks                            and benefits of the procedure and the sedation                            options and risks were discussed with the patient.                            All questions were answered, and informed consent                            was obtained. Prior Anticoagulants: The patient has                            taken no previous anticoagulant or antiplatelet                            agents. ASA Grade Assessment: II - A patient with                            mild systemic disease. After reviewing the risks                            and benefits, the patient was deemed in                            satisfactory condition to undergo the procedure.  After obtaining informed consent, the endoscope was                            passed under direct vision. Throughout the                            procedure, the patient's blood pressure, pulse, and                            oxygen saturations were monitored continuously. The                            Endoscope was introduced through the mouth, and                            advanced to the second part of duodenum. The upper                             GI endoscopy was accomplished without difficulty.                            The patient tolerated the procedure well. Scope In: Scope Out: Findings:                 Normal mucosa was found in the entire esophagus.                            Biopsies were taken with a cold forceps for                            histology to rule out EoE.                           There is no endoscopic evidence of esophagitis or                            stricture in the entire esophagus.                           There was also noted to be increased esophageal                            motility, at times with prolonged contraction                            leading to resistance passing the scope (but not at                            the EGJ).                           The stomach was normal.                           The cardia and gastric fundus were  normal on                            retroflexion.                           The examined duodenum was normal. Biopsies for                            histology were taken with a cold forceps for                            evaluation of celiac disease. Complications:            No immediate complications. Estimated Blood Loss:     Estimated blood loss was minimal. Impression:               - Normal mucosa was found in the entire esophagus.                            Biopsied.                           - Normal stomach.                           - Normal examined duodenum. Biopsied. Recommendation:           - Patient has a contact number available for                            emergencies. The signs and symptoms of potential                            delayed complications were discussed with the                            patient. Return to normal activities tomorrow.                            Written discharge instructions were provided to the                            patient.                           - Resume  previous diet.                           - Continue present medications.                           - Await pathology results.                           - See the other procedure note for documentation of  additional recommendations. [name] L. Loletha Carrow, MD 07/05/2020 4:57:11 PM This report has been signed electronically.

## 2020-07-05 NOTE — Op Note (Signed)
[facility] Patient Name: Yolanda Santana Procedure Date: 07/05/2020 4:06 PM MRN: [id number] Endoscopist: [place]. Loletha Carrow , MD Age: 49 Referring MD:  Date of Birth: Apr 15, 1971 Gender: Female Account #: [id number] Procedure:                Colonoscopy Indications:              Generalized abdominal pain, Chronic diarrhea                            (long-standing IBS, predominantly constipation.                            Over this year predominantly diarrhea) Medicines:                Monitored Anesthesia Care Procedure:                Pre-Anesthesia Assessment:                           - Prior to the procedure, a History and Physical                            was performed, and patient medications and                            allergies were reviewed. The patient's tolerance of                            previous anesthesia was also reviewed. The risks                            and benefits of the procedure and the sedation                            options and risks were discussed with the patient.                            All questions were answered, and informed consent                            was obtained. Prior Anticoagulants: The patient has                            taken no previous anticoagulant or antiplatelet                            agents. ASA Grade Assessment: II - A patient with                            mild systemic disease. After reviewing the risks                            and benefits, the patient was deemed in  satisfactory condition to undergo the procedure.                           After obtaining informed consent, the colonoscope                            was passed under direct vision. Throughout the                            procedure, the patient's blood pressure, pulse, and                            oxygen saturations were monitored continuously. The                            Colonoscope was introduced  through the anus and                            advanced to the the terminal ileum, with                            identification of the appendiceal orifice and IC                            valve. The colonoscopy was performed without                            difficulty - somewhat tortuous left colon. The                            patient tolerated the procedure well. The quality                            of the bowel preparation was good. The terminal                            ileum, ileocecal valve, appendiceal orifice, and                            rectum were photographed. Scope In: 4:19:51 PM Scope Out: 4:34:34 PM Scope Withdrawal Time: 0 hours 9 minutes 47 seconds  Total Procedure Duration: 0 hours 14 minutes 43 seconds  Findings:                 The perianal and digital rectal examinations were                            normal.                           The terminal ileum contained multiple diminutive                            ulcers with surrounding erythema. No bleeding was  present. No stigmata of recent bleeding were seen.                            Biopsies were taken with a cold forceps for                            histology.                           Normal mucosa was found in the entire colon.                            Biopsies for histology were taken with a cold                            forceps from the right colon and left colon for                            evaluation of microscopic colitis.                           The exam was otherwise without abnormality on                            direct and retroflexion views. Complications:            No immediate complications. Estimated Blood Loss:     Estimated blood loss was minimal. Impression:               - A few ulcers in the terminal ileum. Biopsied.                            Appears more like NSAID effect than Crohn's disease.                           - Normal mucosa in  the entire examined colon.                            Biopsied.                           - The examination was otherwise normal on direct                            and retroflexion views. Recommendation:           - Patient has a contact number available for                            emergencies. The signs and symptoms of potential                            delayed complications were discussed with the                            patient. Return to  normal activities tomorrow.                            Written discharge instructions were provided to the                            patient.                           - Resume previous diet.                           - Continue present medications.                           - Await pathology results.                           - No recommendation at this time regarding repeat                            colonoscopy.                           - See the other procedure note for documentation of                            additional recommendations. [name] L. Loletha Carrow, MD 07/05/2020 4:52:36 PM This report has been signed electronically.

## 2020-07-05 NOTE — Progress Notes (Signed)
Vs -CW-admitting

## 2020-07-05 NOTE — Patient Instructions (Signed)
Resume previous diet Continue current medications Await pathology results  YOU HAD AN ENDOSCOPIC PROCEDURE TODAY AT THE [facility]:   Refer to the procedure report that was given to you for any specific questions about what was found during the examination.  If the procedure report does not answer your questions, please call your gastroenterologist to clarify.  If you requested that your care partner not be given the details of your procedure findings, then the procedure report has been included in a sealed envelope for you to review at your convenience later.  YOU SHOULD EXPECT: Some feelings of bloating in the abdomen. Passage of more gas than usual.  Walking can help get rid of the air that was put into your GI tract during the procedure and reduce the bloating. If you had a lower endoscopy (such as a colonoscopy or flexible sigmoidoscopy) you may notice spotting of blood in your stool or on the toilet paper. If you underwent a bowel prep for your procedure, you may not have a normal bowel movement for a few days.  Please Note:  You might notice some irritation and congestion in your nose or some drainage.  This is from the oxygen used during your procedure.  There is no need for concern and it should clear up in a day or so.  SYMPTOMS TO REPORT IMMEDIATELY:   Following lower endoscopy (colonoscopy or flexible sigmoidoscopy):  Excessive amounts of blood in the stool  Significant tenderness or worsening of abdominal pains  Swelling of the abdomen that is new, acute  Fever of 100F or higher   Following upper endoscopy (EGD)  Vomiting of blood or coffee ground material  New chest pain or pain under the shoulder blades  Painful or persistently difficult swallowing  New shortness of breath  Fever of 100F or higher  Black, tarry-looking stools  For urgent or emergent issues, a gastroenterologist can be reached at any hour by calling [phone]. Do not use MyChart  messaging for urgent concerns.   DIET:  We do recommend a small meal at first, but then you may proceed to your regular diet.  Drink plenty of fluids but you should avoid alcoholic beverages for 24 hours.  ACTIVITY:  You should plan to take it easy for the rest of today and you should NOT DRIVE or use heavy machinery until tomorrow (because of the sedation medicines used during the test).    FOLLOW UP: Our staff will call the number listed on your records 48-72 hours following your procedure to check on you and address any questions or concerns that you may have regarding the information given to you following your procedure. If we do not reach you, we will leave a message.  We will attempt to reach you two times.  During this call, we will ask if you have developed any symptoms of COVID 19. If you develop any symptoms (ie: fever, flu-like symptoms, shortness of breath, cough etc.) before then, please call [phone].  If you test positive for Covid 19 in the 2 weeks post procedure, please call and report this information to us.    If any biopsies were taken you will be contacted by phone or by letter within the next 1-3 weeks.  Please call us at [phone] if you have not heard about the biopsies in 3 weeks.   SIGNATURES/CONFIDENTIALITY: You and/or your care partner have signed paperwork which will be entered into your electronic medical record.  These signatures attest to   the fact that that the information above on your After Visit Summary has been reviewed and is understood.  Full responsibility of the confidentiality of this discharge information lies with you and/or your care-partner.

## 2020-07-05 NOTE — Progress Notes (Signed)
Report to PACU, RN, vss, BBS= Clear.  

## 2020-07-07 ENCOUNTER — Telehealth: Payer: Self-pay

## 2020-07-07 NOTE — Telephone Encounter (Signed)
No answer, unable to leave a message, B.Aubriana Ravelo RN. 

## 2020-07-07 NOTE — Telephone Encounter (Signed)
  Follow up Call-  Call back number 07/05/2020  Post procedure Call Back phone  # [phone]  Permission to leave phone message Yes  Some recent data might be hidden   1st follow up call made.  NAULM-mailbox full

## 2020-07-13 ENCOUNTER — Other Ambulatory Visit: Payer: Self-pay

## 2020-07-18 ENCOUNTER — Other Ambulatory Visit: Payer: Self-pay | Admitting: Family Medicine

## 2020-07-18 MED ORDER — ALPRAZOLAM 0.5 MG PO TABS
0.5000 mg | ORAL_TABLET | Freq: Every day | ORAL | 0 refills | Status: DC | PRN
Start: 2020-07-18 — End: 2020-11-13

## 2020-07-18 MED ORDER — ESCITALOPRAM OXALATE 10 MG PO TABS
10.0000 mg | ORAL_TABLET | Freq: Every day | ORAL | 1 refills | Status: DC
Start: 2020-07-18 — End: 2021-04-13

## 2020-07-18 MED ORDER — ESCITALOPRAM OXALATE 10 MG PO TABS
10.0000 mg | ORAL_TABLET | Freq: Every day | ORAL | 0 refills | Status: DC
Start: 2020-07-18 — End: 2020-07-18

## 2020-07-18 MED ORDER — VYVANSE 50 MG PO CAPS
50.0000 mg | ORAL_CAPSULE | Freq: Every day | ORAL | 0 refills | Status: DC
Start: 2020-07-18 — End: 2020-11-13

## 2020-07-26 ENCOUNTER — Encounter: Payer: Self-pay | Admitting: Physician Assistant

## 2020-07-26 ENCOUNTER — Ambulatory Visit (HOSPITAL_COMMUNITY)
Admission: RE | Admit: 2020-07-26 | Discharge: 2020-07-26 | Disposition: A | Discharge status: Home / Self Care | Payer: Managed Care, Other (non HMO) | Source: Ambulatory Visit | Attending: Physician Assistant | Admitting: Physician Assistant

## 2020-07-26 ENCOUNTER — Other Ambulatory Visit: Payer: Self-pay

## 2020-07-26 ENCOUNTER — Other Ambulatory Visit (HOSPITAL_COMMUNITY): Payer: Self-pay | Admitting: Vascular Surgery

## 2020-07-26 ENCOUNTER — Ambulatory Visit: Payer: Managed Care, Other (non HMO) | Admitting: Physician Assistant

## 2020-07-26 VITALS — BP 111/60 | HR 73 | Temp 98.2°F | Wt 162.9 lb

## 2020-07-26 DIAGNOSIS — I83813 Varicose veins of bilateral lower extremities with pain: Secondary | ICD-10-CM

## 2020-07-26 DIAGNOSIS — I83893 Varicose veins of bilateral lower extremities with other complications: Secondary | ICD-10-CM

## 2020-07-26 DIAGNOSIS — I8393 Asymptomatic varicose veins of bilateral lower extremities: Secondary | ICD-10-CM
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
## 2020-08-09 ENCOUNTER — Other Ambulatory Visit: Payer: Self-pay

## 2020-08-09 ENCOUNTER — Ambulatory Visit (HOSPITAL_COMMUNITY)
Admission: RE | Admit: 2020-08-09 | Discharge: 2020-08-09 | Disposition: A | Discharge status: Home / Self Care | Payer: Managed Care, Other (non HMO) | Source: Ambulatory Visit | Attending: Family Medicine | Admitting: Family Medicine

## 2020-08-09 DIAGNOSIS — K219 Gastro-esophageal reflux disease without esophagitis: Secondary | ICD-10-CM | POA: Insufficient documentation

## 2020-08-09 DIAGNOSIS — R011 Cardiac murmur, unspecified: Secondary | ICD-10-CM | POA: Diagnosis not present

## 2020-08-09 DIAGNOSIS — R002 Palpitations: Secondary | ICD-10-CM | POA: Diagnosis not present

## 2020-08-09 LAB — ECHOCARDIOGRAM COMPLETE
Area-P 1/2: 2.69 cm2
S' Lateral: 3.2 cm

## 2020-08-09 NOTE — Progress Notes (Signed)
  Echocardiogram 2D Echocardiogram has been performed.  Yolanda Santana 08/09/2020, 4:29 PM

## 2020-08-16 ENCOUNTER — Ambulatory Visit: Payer: Managed Care, Other (non HMO) | Admitting: Gastroenterology

## 2020-08-23 DIAGNOSIS — J309 Allergic rhinitis, unspecified: Secondary | ICD-10-CM | POA: Insufficient documentation

## 2020-08-30 ENCOUNTER — Encounter: Payer: Managed Care, Other (non HMO) | Admitting: Obstetrics and Gynecology

## 2020-09-15 ENCOUNTER — Encounter: Payer: Self-pay | Admitting: Nurse Practitioner

## 2020-09-15 ENCOUNTER — Ambulatory Visit (INDEPENDENT_AMBULATORY_CARE_PROVIDER_SITE_OTHER): Payer: 59 | Admitting: Nurse Practitioner

## 2020-09-15 VITALS — BP 114/70 | HR 68 | Resp 16 | Ht 65.75 in | Wt 164.0 lb

## 2020-09-15 DIAGNOSIS — F439 Reaction to severe stress, unspecified: Secondary | ICD-10-CM | POA: Diagnosis not present

## 2020-09-15 DIAGNOSIS — Z01419 Encounter for gynecological examination (general) (routine) without abnormal findings: Secondary | ICD-10-CM

## 2020-09-15 DIAGNOSIS — N951 Menopausal and female climacteric states: Secondary | ICD-10-CM

## 2020-09-15 MED ORDER — PROGESTERONE MICRONIZED 100 MG PO CAPS: 100.0000 mg | ORAL_CAPSULE | Freq: Every day | ORAL | 12 refills | Status: DC

## 2020-09-15 MED ORDER — ESTRADIOL 0.05 MG/24HR TD PTTW: 1.0000 | MEDICATED_PATCH | TRANSDERMAL | 12 refills | Status: DC
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
## 2020-09-15 NOTE — Patient Instructions (Addendum)
Nice to meet you today Yolanda Santana!  I know that is far from your house, here is the link to a website for the therapy group that I think is great. They are the [facility] for [facility]: https://www.chhtree.com/ 505-147-9721   Also, consider checking with your insurance for in-network providers or try your company's EAP (employee assistance program)   Caring for [place] health is emotional, psychological, and social well-being. Mental health is just as important as physical health. In fact, mental and physical health are connected, and you need both to be healthy. Some signs of good mental health (well-being) include:  Being able to attend to tasks at home, school, or work.  Being able to manage stress and emotions.  Practicing self-care, which may include: ? A regular exercise pattern. ? A reasonably healthy diet. ? Supportive and trusting relationships. ? The ability to relax and calm yourself (self-calm).  Having pleasurable hobbies and activities to do.  Believing that you have meaning and purpose in your life.  Recovering and adjusting after facing challenges (resilience). You can take steps to build or strengthen these mentally healthy behaviors. There are resources and support to help you with this. Why is caring for mental health important? Caring for your mental health is a big part of staying healthy. Everyone has times when feelings, thoughts, or situations feel overwhelming. Mental health means having the skills to manage what feels overwhelming. If this sense of being overwhelmed persists, however, you might need some help. If you have some of the following signs, you may need to take better care of your mental health or seek help from a health care provider or mental health professional:  Problems with energy or focus.  Changes in eating habits.  Problems sleeping, such as sleeping too much or not enough.  Emotional distress, such as anger, sadness,  depression, or anxiety.  Major changes in your relationships.  Losing interest in life or activities that you used to enjoy. If you have any of these symptoms on most days for 2 weeks or longer:  Talk with a close friend or family member about how you are feeling.  Contact your health care provider to discuss your symptoms.  Consider working with a Education officer, community. Your health care provider, family, or friends may be able to recommend a therapist. What can I do to promote emotional and mental health? Managing emotions  Learn to identify emotions and deal with them. Recognizing your emotions is the first step in learning to deal with them.  Practice ways to appropriately express feelings. Remember that you can control your feelings. They do not control you.  Practice stress management techniques, such as: ? Relaxation techniques, like breathing or muscle relaxation exercises. ? Exercise. Regular activity can lower your stress level. ? Changing what you can change and accepting what you cannot change.  Build up your resilience so that you can recover and adjust after big problems or challenges. Practice resilient behaviors and attitudes: ? Set and focus on long-term goals. ? Develop and maintain healthy, supportive relationships. ? Learn to accept change and make the best of the situation. ? Take care of yourself physically by eating a healthy diet, getting plenty of sleep, and exercising regularly. ? Develop self-awareness. Ask others to give feedback about how they see you. ? Practice mindfulness meditation to help you stay calm when dealing with daily challenges. ? Learn to respond to situations in healthy ways, rather than reacting with your emotions. ?  Keep a positive attitude, and believe in yourself. Your view of yourself affects your mental health. ? Develop your listening and empathy skills. These will help you deal with difficult situations and  communications.  Remember that emotions can be used as a good source of communication and are a great source of energy. Try to laugh and find humor in life. Sleeping  Get the right amount and quality of sleep. Sleep has a big impact on physical and mental health. To improve your sleep: ? Go to bed and wake up around the same time every day. ? Limit screen time before bedtime. This includes the use of your cell phone, TV, computer, and tablet. ? Keep your bedroom dark and cool. Activity  Exercise or do some physical activity regularly. This helps: ? Keep your body strong, especially during times of stress. ? Get rid of chemicals in your body (hormones) that build up when you are stressed. ? Build up your resilience.   Eating and drinking  Eat a healthy diet that includes whole grains, vegetables, fresh fruits, and lean proteins. If you have questions about what foods are best for you, ask your health care provider.  Try not to turn to sweet, salty, or otherwise unhealthy foods when you are tired or unhappy. This can lead to unwanted weight gain and is not a healthy way to cope with emotions.   Where to find more information You can find more information about how to care for your mental health from:  [employer] on Mental Illness (NAMI): www.nami.[place]: https://carter.com/  Centers for Disease Control and Prevention: RapLives.dk Contact a health care provider if:  You lose interest in being with others or you do not want to leave the house.  You have a hard time completing your normal activities or you have less energy than normal.  You cannot stay focused or you have problems with memory.  You feel that your senses are heightened, and this makes you upset or concerned.  You feel nervous or have rapid mood changes.  You are sleeping or eating more or less than normal.  You question reality or you show odd behavior that  disturbs you or others. Get help right away if:  You have thoughts about hurting yourself or others. If you ever feel like you may hurt yourself or others, or have thoughts about taking your own life, get help right away. You can go to your nearest emergency department or call:  Your local emergency services (911 in the [place]  A suicide crisis helpline, such as the [place] at (773)323-3012. This is open 24 hours a day. Summary  Mental health is not just the absence of mental illness. It involves understanding your emotions and behaviors, and taking steps to cope with them in a healthy way.  If you have symptoms of mental or emotional distress, get help from family, friends, a health care provider, or a mental health professional.  Practice good mental health behaviors such as stress management skills, self-calming skills, exercise, and healthy sleeping and eating. This information is not intended to replace advice given to you by your health care provider. Make sure you discuss any questions you have with your health care provider. Document Revised: 02/03/2020 Document Reviewed: 02/03/2020 Elsevier Patient Education  2021 [place] is the normal time of a woman's life when the levels of estrogen, the female hormone produced by the ovaries, begin to decrease.  This leads to changes in menstrual periods before they stop completely (menopause). Perimenopause can begin 2-8 years before menopause. During perimenopause, the ovaries may or may not produce an egg and a woman can still become pregnant. What are the causes? This condition is caused by a natural change in hormone levels that happens as you get older. What increases the risk? This condition is more likely to start at an earlier age if you have certain medical conditions or have undergone treatments, including:  A tumor of the pituitary gland in the brain.  A disease  that affects the ovaries and hormone production.  Certain cancer treatments, such as chemotherapy or hormone therapy, or radiation therapy on the pelvis.  Heavy smoking and excessive alcohol use.  Family history of early menopause. What are the signs or symptoms? Perimenopausal changes affect each woman differently. Symptoms of this condition may include:  Hot flashes.  Irregular menstrual periods.  Night sweats.  Changes in feelings about sex. This could be a decrease in sex drive or an increased discomfort around your sexuality.  Vaginal dryness.  Headaches.  Mood swings.  Depression.  Problems sleeping (insomnia).  Memory problems or trouble concentrating.  Irritability.  Tiredness.  Weight gain.  Anxiety.  Trouble getting pregnant. How is this diagnosed? This condition is diagnosed based on your medical history, a physical exam, your age, your menstrual history, and your symptoms. Hormone tests may also be done. How is this treated? In some cases, no treatment is needed. You and your health care provider should make a decision together about whether treatment is necessary. Treatment will be based on your individual condition and preferences. Various treatments are available, such as:  Menopausal hormone therapy (MHT).  Medicines to treat specific symptoms.  Acupuncture.  Vitamin or herbal supplements. Before starting treatment, make sure to let your health care provider know if you have a personal or family history of:  Heart disease.  Breast cancer.  Blood clots.  Diabetes.  Osteoporosis. Follow these instructions at home: Medicines  Take over-the-counter and prescription medicines only as told by your health care provider.  Take vitamin supplements only as told by your health care provider.  Talk with your health care provider before starting any herbal supplements. Lifestyle  Do not use any products that contain nicotine or tobacco, such  as cigarettes, e-cigarettes, and chewing tobacco. If you need help quitting, ask your health care provider.  Get at least 30 minutes of physical activity on 5 or more days each week.  Eat a balanced diet that includes fresh fruits and vegetables, whole grains, soybeans, eggs, lean meat, and low-fat dairy.  Avoid alcoholic and caffeinated beverages, as well as spicy foods. This may help prevent hot flashes.  Get 7-8 hours of sleep each night.  Dress in layers that can be removed to help you manage hot flashes.  Find ways to manage stress, such as deep breathing, meditation, or journaling.   General instructions  Keep track of your menstrual periods, including: ? When they occur. ? How heavy they are and how long they last. ? How much time passes between periods.  Keep track of your symptoms, noting when they start, how often you have them, and how long they last.  Use vaginal lubricants or moisturizers to help with vaginal dryness and improve comfort during sex.  You can still become pregnant if you are having irregular periods. Make sure you use contraception during perimenopause if you do not want to get pregnant.  Keep all follow-up visits. This is important. This includes any group therapy or counseling.   Contact a health care provider if:  You have heavy vaginal bleeding or pass blood clots.  Your period lasts more than 2 days longer than normal.  Your periods are recurring sooner than 21 days.  You bleed after having sex.  You have pain during sex. Get help right away if you have:  Chest pain, trouble breathing, or trouble talking.  Severe depression.  Pain when you urinate.  Severe headaches.  Vision problems. Summary  Perimenopause is the time when a woman's body begins to move into menopause. This may happen naturally or as a result of other health problems or medical treatments.  Perimenopause can begin 2-8 years before menopause, and it can last for  several years.  Perimenopausal symptoms can be managed through medicines, lifestyle changes, and complementary therapies such as acupuncture. This information is not intended to replace advice given to you by your health care provider. Make sure you discuss any questions you have with your health care provider. Document Revised: 01/27/2020 Document Reviewed: 01/27/2020 Elsevier Patient Education  [place] Maintenance, Female Adopting a healthy lifestyle and getting preventive care are important in promoting health and wellness. Ask your health care provider about:  The right schedule for you to have regular tests and exams.  Things you can do on your own to prevent diseases and keep yourself healthy. What should I know about diet, weight, and exercise? Eat a healthy diet  Eat a diet that includes plenty of vegetables, fruits, low-fat dairy products, and lean protein.  Do not eat a lot of foods that are high in solid fats, added sugars, or sodium.   Maintain a healthy weight Body mass index (BMI) is used to identify weight problems. It estimates body fat based on height and weight. Your health care provider can help determine your BMI and help you achieve or maintain a healthy weight. Get regular exercise Get regular exercise. This is one of the most important things you can do for your health. Most adults should:  Exercise for at least 150 minutes each week. The exercise should increase your heart rate and make you sweat (moderate-intensity exercise).  Do strengthening exercises at least twice a week. This is in addition to the moderate-intensity exercise.  Spend less time sitting. Even light physical activity can be beneficial. Watch cholesterol and blood lipids Have your blood tested for lipids and cholesterol at 51 years of age, then have this test every 5 years. Have your cholesterol levels checked more often if:  Your lipid or cholesterol levels are  high.  You are older than 51 years of age.  You are at high risk for heart disease. What should I know about cancer screening? Depending on your health history and family history, you may need to have cancer screening at various ages. This may include screening for:  Breast cancer.  Cervical cancer.  Colorectal cancer.  Skin cancer.  Lung cancer. What should I know about heart disease, diabetes, and high blood pressure? Blood pressure and heart disease  High blood pressure causes heart disease and increases the risk of stroke. This is more likely to develop in people who have high blood pressure readings, are of African descent, or are overweight.  Have your blood pressure checked: ? Every 3-5 years if you are 55-67 years of age. ? Every year if you are 105 years old or older. Diabetes Have  regular diabetes screenings. This checks your fasting blood sugar level. Have the screening done:  Once every three years after age 740 if you are at a normal weight and have a low risk for diabetes.  More often and at a younger age if you are overweight or have a high risk for diabetes. What should I know about preventing infection? Hepatitis B If you have a higher risk for hepatitis B, you should be screened for this virus. Talk with your health care provider to find out if you are at risk for hepatitis B infection. Hepatitis C Testing is recommended for:  Everyone born from [birth year] through 1966.  Anyone with known risk factors for hepatitis C. Sexually transmitted infections (STIs)  Get screened for STIs, including gonorrhea and chlamydia, if: ? You are sexually active and are younger than 51 years of age. ? You are older than 51 years of age and your health care provider tells you that you are at risk for this type of infection. ? Your sexual activity has changed since you were last screened, and you are at increased risk for chlamydia or gonorrhea. Ask your health care provider if you  are at risk.  Ask your health care provider about whether you are at high risk for HIV. Your health care provider may recommend a prescription medicine to help prevent HIV infection. If you choose to take medicine to prevent HIV, you should first get tested for HIV. You should then be tested every 3 months for as long as you are taking the medicine. Pregnancy  If you are about to stop having your period (premenopausal) and you may become pregnant, seek counseling before you get pregnant.  Take 400 to 800 micrograms (mcg) of folic acid every day if you become pregnant.  Ask for birth control (contraception) if you want to prevent pregnancy. Osteoporosis and menopause Osteoporosis is a disease in which the bones lose minerals and strength with aging. This can result in bone fractures. If you are 449 years old or older, or if you are at risk for osteoporosis and fractures, ask your health care provider if you should:  Be screened for bone loss.  Take a calcium or vitamin D supplement to lower your risk of fractures.  Be given hormone replacement therapy (HRT) to treat symptoms of menopause. Follow these instructions at home: Lifestyle  Do not use any products that contain nicotine or tobacco, such as cigarettes, e-cigarettes, and chewing tobacco. If you need help quitting, ask your health care provider.  Do not use street drugs.  Do not share needles.  Ask your health care provider for help if you need support or information about quitting drugs. Alcohol use  Do not drink alcohol if: ? Your health care provider tells you not to drink. ? You are pregnant, may be pregnant, or are planning to become pregnant.  If you drink alcohol: ? Limit how much you use to 0-1 drink a day. ? Limit intake if you are breastfeeding.  Be aware of how much alcohol is in your drink. In the [place] one drink equals one 12 oz bottle of beer (355 mL), one 5 oz glass of wine (148 mL), or one 1 oz glass of hard  liquor (44 mL). General instructions  Schedule regular health, dental, and eye exams.  Stay current with your vaccines.  Tell your health care provider if: ? You often feel depressed. ? You have ever been abused or do not feel safe at home.  Summary  Adopting a healthy lifestyle and getting preventive care are important in promoting health and wellness.  Follow your health care provider's instructions about healthy diet, exercising, and getting tested or screened for diseases.  Follow your health care provider's instructions on monitoring your cholesterol and blood pressure. This information is not intended to replace advice given to you by your health care provider. Make sure you discuss any questions you have with your health care provider. Document Revised: 08/05/2018 Document Reviewed: 08/05/2018 Elsevier Patient Education  2021 [employer].

## 2020-10-05 ENCOUNTER — Ambulatory Visit: Payer: Managed Care, Other (non HMO) | Admitting: Gastroenterology

## 2020-10-18 ENCOUNTER — Encounter: Payer: Self-pay | Admitting: Gastroenterology

## 2020-10-18 ENCOUNTER — Other Ambulatory Visit: Payer: Self-pay

## 2020-10-18 ENCOUNTER — Ambulatory Visit (INDEPENDENT_AMBULATORY_CARE_PROVIDER_SITE_OTHER): Payer: 59 | Admitting: Gastroenterology

## 2020-10-18 VITALS — BP 118/74 | HR 78 | Ht 65.0 in | Wt 163.2 lb

## 2020-10-18 DIAGNOSIS — K580 Irritable bowel syndrome with diarrhea: Secondary | ICD-10-CM | POA: Diagnosis not present

## 2020-10-18 DIAGNOSIS — K638219 Small intestinal bacterial overgrowth, unspecified: Secondary | ICD-10-CM | POA: Insufficient documentation

## 2020-10-18 DIAGNOSIS — R1084 Generalized abdominal pain: Secondary | ICD-10-CM | POA: Diagnosis not present

## 2020-10-18 DIAGNOSIS — K6389 Other specified diseases of intestine: Secondary | ICD-10-CM

## 2020-10-18 MED ORDER — RIFAXIMIN 550 MG PO TABS: 550.0000 mg | ORAL_TABLET | Freq: Three times a day (TID) | ORAL | 0 refills | Status: AC

## 2020-10-18 NOTE — Patient Instructions (Signed)
If you are age 51 or older, your body mass index should be between 23-30. Your Body mass index is 27.16 kg/m. If this is out of the aforementioned range listed, please consider follow up with your Primary Care Provider.  If you are age 64 or younger, your body mass index should be between 19-25. Your Body mass index is 27.16 kg/m. If this is out of the aformentioned range listed, please consider follow up with your Primary Care Provider.   We have sent your demographic information and a prescription for Xifaxan to [facility]. This pharmacy is able to get medication approved through insurance and get you the lowest copay possible. If you have not heard from them within 1 week, please call our office at [phone] to let us know.  Follow up with Dr. Loletha Carrow in April.
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
## 2020-10-24 NOTE — Progress Notes (Signed)
____________________________________________________________  Attending physician addendum:  Thank you for sending this case to me. I have reviewed the entire note and agree with the plan.  I certainly understand the frustration of the symptoms, which I believe to be IBS. Empiric therapy for SIBO with rifaximin is entirely reasonable in this scenario.  Wilfrid Lund, MD  ____________________________________________________________

## 2020-10-31 ENCOUNTER — Ambulatory Visit (INDEPENDENT_AMBULATORY_CARE_PROVIDER_SITE_OTHER): Payer: 59 | Admitting: Family Medicine

## 2020-10-31 ENCOUNTER — Other Ambulatory Visit: Payer: Self-pay

## 2020-10-31 ENCOUNTER — Encounter: Payer: Self-pay | Admitting: Family Medicine

## 2020-10-31 VITALS — BP 110/70 | HR 75 | Temp 98.9°F | Ht 65.0 in | Wt 162.8 lb

## 2020-10-31 DIAGNOSIS — F339 Major depressive disorder, recurrent, unspecified: Secondary | ICD-10-CM | POA: Diagnosis not present

## 2020-10-31 DIAGNOSIS — R5383 Other fatigue: Secondary | ICD-10-CM

## 2020-10-31 DIAGNOSIS — F419 Anxiety disorder, unspecified: Secondary | ICD-10-CM

## 2020-10-31 MED ORDER — BUPROPION HCL ER (XL) 150 MG PO TB24
150.0000 mg | ORAL_TABLET | Freq: Every day | ORAL | 2 refills | Status: DC
Start: 2020-10-31 — End: 2020-11-29

## 2020-10-31 NOTE — Patient Instructions (Signed)
[email or web address]">  Depression Screening Depression screening is a tool that your health care provider can use to learn if you have symptoms of depression. Depression is a common condition with many symptoms that are also often found in other conditions. Depression is treatable, but it must first be diagnosed. You may not know that certain feelings, thoughts, and behaviors that you are having can be symptoms of depression. Taking a depression screening test can help you and your health care provider decide if you need more assessment, or if you should be referred to a mental health care provider. What are the screening tests?  You may have a physical exam to see if another condition is affecting your mental health. You may have a blood or urine sample taken during the physical exam.  You may be interviewed using a screening tool that was developed from research, such as one of these: ? Patient Health Questionnaire (PHQ). This is a set of either 2 or 9 questions. A health care provider who has been trained to score this screening test uses a guide to assess if your symptoms suggest that you may have depression. ? Hamilton Depression Rating Scale (HAM-D). This is a set of either 17 or 24 questions. You may be asked to take it again during or after your treatment, to see if your depression has gotten better. ? Beck Depression Inventory (BDI). This is a set of 21 multiple choice questions. Your health care provider scores your answers to assess:  Your level of depression, ranging from mild to severe.  Your response to treatment.  Your health care provider may talk with you about your daily activities, such as eating, sleeping, work, and recreation, and ask if you have had any changes in activity.  Your health care provider may ask you to see a mental health specialist, such as a psychiatrist or psychologist, for more  evaluation. Who should be screened for depression?  All adults, including adults with a family history of a mental health disorder.  Adolescents who are 51-51 years old.  People who are recovering from a myocardial infarction (MI).  Pregnant women, or women who have given birth.  People who have a long-term (chronic) illness.  Anyone who has been diagnosed with another type of a mental health disorder.  Anyone who has symptoms that could show depression.   What do my results mean? Your health care provider will review the results of your depression screening, physical exam, and lab tests. Positive screens suggest that you may have depression. Screening is the first step in getting the care that you may need. It is up to you to get your screening results. Ask your health care provider, or the department that is doing your screening tests, when your results will be ready. Talk with your health care provider about your results and diagnosis. A diagnosis of depression is made using the Diagnostic and Statistical Manual of Mental Disorders (DSM-V). This is a book that lists the number and type of symptoms that must be present for a health care provider to give a specific diagnosis.  Your health care provider may work with you to treat your symptoms of depression, or your health care provider may help you find a mental health provider who can assess, diagnose, and treat your depression. Get help right away if:  You have thoughts about hurting yourself or others. If you ever feel like you may hurt yourself or others, or have thoughts about taking your own life, get help  right away. You can go to your nearest emergency department or call:  Your local emergency services (911 in the [place]  A suicide crisis helpline, such as the [facility] at (779)432-9194. This is open 24 hours a day. Summary  Depression screening is the first step in getting the help that you may  need.  If your screening test shows symptoms of depression (is positive), your health care provider may ask you to see a mental health provider.  Anyone who is age 51 or older should be screened for depression. This information is not intended to replace advice given to you by your health care provider. Make sure you discuss any questions you have with your health care provider. Document Revised: 02/03/2020 Document Reviewed: 02/03/2020 Elsevier Patient Education  [place].
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
## 2020-11-13 ENCOUNTER — Telehealth: Payer: Self-pay | Admitting: Family Medicine

## 2020-11-14 MED ORDER — ALPRAZOLAM 0.5 MG PO TABS: 0.5000 mg | ORAL_TABLET | Freq: Every day | ORAL | 0 refills | Status: DC | PRN

## 2020-11-14 MED ORDER — VYVANSE 50 MG PO CAPS: 50.0000 mg | ORAL_CAPSULE | Freq: Every day | ORAL | 0 refills | Status: DC

## 2020-11-20 NOTE — Telephone Encounter (Signed)
Pts spouse is calling in to see if the pt can get a lower dosage of her VYVANSE from 50 MG. Pharm:  [facility] on [address].  Spouse would like to have a call if any questions and he is on the pts DPR.

## 2020-11-22 NOTE — Telephone Encounter (Signed)
Sure we could do a lower dose. Can you find out what is going on with current dose so I can advise/send in appropriate new dose? Then make sure she has f/u appointment set up in a month or so to review due to change in dose.

## 2020-11-29 ENCOUNTER — Ambulatory Visit (INDEPENDENT_AMBULATORY_CARE_PROVIDER_SITE_OTHER): Payer: 59 | Admitting: Family Medicine

## 2020-11-29 ENCOUNTER — Encounter: Payer: Self-pay | Admitting: Family Medicine

## 2020-11-29 ENCOUNTER — Other Ambulatory Visit: Payer: Self-pay

## 2020-11-29 VITALS — BP 100/72 | HR 70 | Temp 98.2°F | Ht 65.0 in | Wt 158.8 lb

## 2020-11-29 DIAGNOSIS — F988 Other specified behavioral and emotional disorders with onset usually occurring in childhood and adolescence: Secondary | ICD-10-CM

## 2020-11-29 DIAGNOSIS — F321 Major depressive disorder, single episode, moderate: Secondary | ICD-10-CM

## 2020-11-29 NOTE — Progress Notes (Signed)
Yolanda Santana DOB: May 01, 1971 Encounter date: 11/29/2020  This is a 50 y.o. female who presents with Chief Complaint  Patient presents with  . Follow-up    History of present illness: Saw Dr. Elease Hashimoto on 10/31/20.  Wellbutrin 150 daily was added to her baseline Lexapro 10 mg daily to help with depression symptoms.  Decided that she didn't want to take the wellbutrin. Has dealt with depression whole life. Just a lot of changes - moving, children graduating, getting older, working hard, financial stressors from medical bills last year. Unfortunately although working in healthcare, her insurance doesn't cover services well.   Just started on hormone replacement. Hoping this helps with how she is feeling overall. We discussed some benefits including energy level and mood as well as weight management that some patients feel with this.  Allergies  Allergen Reactions  . Penicillins Rash   Current Meds  Medication Sig  . ALPRAZolam (XANAX) 0.5 MG tablet Take 1 tablet (0.5 mg total) by mouth daily as needed for anxiety.  . Cholecalciferol (VITAMIN D-3 PO) Take 5,000 Units by mouth every other day.  . escitalopram (LEXAPRO) 10 MG tablet Take 1 tablet (10 mg total) by mouth daily.  Marland Kitchen estradiol (VIVELLE-DOT) 0.05 MG/24HR patch Place 1 patch (0.05 mg total) onto the skin 2 (two) times a week.  . NON FORMULARY Number of herbs and supplements  . progesterone (PROMETRIUM) 100 MG capsule Take 1 capsule (100 mg total) by mouth daily.  . valACYclovir (VALTREX) 1000 MG tablet Take 2,000 mg by mouth as needed.   Marland Kitchen VYVANSE 50 MG capsule Take 1 capsule (50 mg total) by mouth daily.    Review of Systems  Constitutional: Negative for chills, fatigue and fever.  Respiratory: Negative for cough, chest tightness, shortness of breath and wheezing.   Cardiovascular: Negative for chest pain, palpitations and leg swelling.  Psychiatric/Behavioral: Positive for decreased concentration. Negative for suicidal ideas.  The patient is nervous/anxious.     Objective:  BP 100/72 (BP Location: Left Arm, Patient Position: Sitting, Cuff Size: Large)   Pulse 70   Temp 98.2 F (36.8 C) (Oral)   Ht 5\' 5"  (1.651 m)   Wt 158 lb 12.8 oz (72 kg)   BMI 26.43 kg/m   Weight: 158 lb 12.8 oz (72 kg)   BP Readings from Last 3 Encounters:  11/29/20 100/72  10/31/20 110/70  10/18/20 118/74   Wt Readings from Last 3 Encounters:  11/29/20 158 lb 12.8 oz (72 kg)  10/31/20 162 lb 12.8 oz (73.8 kg)  10/18/20 163 lb 3.2 oz (74 kg)    Physical Exam Constitutional:      General: She is not in acute distress.    Appearance: She is well-developed.  Cardiovascular:     Rate and Rhythm: Normal rate and regular rhythm.     Heart sounds: Normal heart sounds. No murmur heard. No friction rub.  Pulmonary:     Effort: Pulmonary effort is normal. No respiratory distress.     Breath sounds: Normal breath sounds. No wheezing or rales.  Musculoskeletal:     Right lower leg: No edema.     Left lower leg: No edema.  Neurological:     Mental Status: She is alert and oriented to person, place, and time.  Psychiatric:        Attention and Perception: Attention normal.        Mood and Affect: Mood normal.        Speech: Speech normal.  Behavior: Behavior normal.        Thought Content: Thought content normal.        Cognition and Memory: Cognition normal.     Assessment/Plan  1. Depression, major, single episode, moderate ([place]) She prefers just to continue with lexapro alone. She has done a lot of therapy in the past, and doesn't feel this would be helpful for her currently. I have encouraged her to reach out if she feels that she needs more intervention/help with mood.   2. Attention deficit disorder, unspecified hyperactivity presence She would like to taper down on vyvanse. It is expensive and she doesn't like feeling of depending on it for functioning. She will let me know when due for next refill so we can dose  adjust.   Return in about 6 months (around 05/31/2021) for Chronic condition visit.    Micheline Rough, MD
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
## 2020-12-14 ENCOUNTER — Encounter: Payer: Self-pay | Admitting: Nurse Practitioner

## 2020-12-14 ENCOUNTER — Other Ambulatory Visit: Payer: Self-pay

## 2020-12-14 ENCOUNTER — Ambulatory Visit (INDEPENDENT_AMBULATORY_CARE_PROVIDER_SITE_OTHER): Payer: 59 | Admitting: Nurse Practitioner

## 2020-12-14 DIAGNOSIS — N951 Menopausal and female climacteric states: Secondary | ICD-10-CM | POA: Diagnosis not present

## 2020-12-14 NOTE — Patient Instructions (Addendum)
It was good to see you today. I enjoyed Product manager. Be kind to yourself, you are amazing.   Don't forget basics when it comes to good overall health: Exercise 150 min moderate exercise per week (try Yoga) Eat a lot of fresh fruits and vegetables Avoid processed foods Get a little sunshine Get good sleep, 7-8 hours per night Drink plenty of water See assistance when needed   Here are some good resources you may want to check out:  Menopause Manifesto by Dr. Jeanine Luz  Think Like a Monk by Len Blalock  Good Morning I love you by Kyung Bacca

## 2020-12-20 ENCOUNTER — Ambulatory Visit: Payer: Self-pay | Admitting: Gastroenterology

## 2020-12-23 ENCOUNTER — Other Ambulatory Visit: Payer: Self-pay | Admitting: Family Medicine

## 2020-12-25 ENCOUNTER — Other Ambulatory Visit: Payer: Self-pay | Admitting: Family Medicine

## 2020-12-25 NOTE — Telephone Encounter (Signed)
At her last visit she had mentioned wanting to wean off vyvanse. Would she like for me to send a lower dose in for her this time? We could do a 40mg  if she would like? Let me know before I refill the 50mg . Thanks!

## 2020-12-26 ENCOUNTER — Other Ambulatory Visit: Payer: Self-pay | Admitting: Family Medicine

## 2020-12-27 ENCOUNTER — Other Ambulatory Visit: Payer: Self-pay | Admitting: Family Medicine

## 2020-12-27 MED ORDER — LISDEXAMFETAMINE DIMESYLATE 40 MG PO CAPS: 40.0000 mg | ORAL_CAPSULE | ORAL | 0 refills | Status: DC

## 2020-12-27 NOTE — Telephone Encounter (Signed)
New dose for 40mg  sent.

## 2020-12-28 NOTE — Telephone Encounter (Signed)
It should be taken care of now. Not sure why denial message not working.

## 2021-01-15 ENCOUNTER — Encounter: Payer: Self-pay | Admitting: Family Medicine

## 2021-01-31 ENCOUNTER — Other Ambulatory Visit: Payer: Self-pay | Admitting: Family Medicine

## 2021-01-31 MED ORDER — LISDEXAMFETAMINE DIMESYLATE 30 MG PO CAPS: 30.0000 mg | ORAL_CAPSULE | Freq: Every day | ORAL | 0 refills | Status: DC

## 2021-01-31 NOTE — Telephone Encounter (Signed)
Per her prior request of tapering down I have sent in 30mg  vyvanse (rather than 40mg  that was requested). If she would like to do a bigger jump down to the 20mg  instead let me know.

## 2021-02-01 MED ORDER — LISDEXAMFETAMINE DIMESYLATE 40 MG PO CAPS: 40.0000 mg | ORAL_CAPSULE | ORAL | 0 refills | Status: DC

## 2021-02-01 NOTE — Telephone Encounter (Signed)
I have resent the 40mg  for her. Please cancel the 30mg  at the pharmacy (OR, if they are able to hold; she can get the 30mg  next month as part of taper down, but I suspect they won't do this)

## 2021-02-02 NOTE — Telephone Encounter (Signed)
Spoke with the pharmacist Ebony Hail at [facility] and informed her of the message below.  Ebony Hail stated the Rx will be returned and the pt can request this next month.

## 2021-02-28 ENCOUNTER — Other Ambulatory Visit: Payer: Self-pay | Admitting: Family Medicine

## 2021-02-28 NOTE — Telephone Encounter (Signed)
Please confirm that she wants 40mg  vyvanse refill? She was planning to taper down; not sure if she is ready to go down to 30 yet.

## 2021-03-02 ENCOUNTER — Other Ambulatory Visit: Payer: Self-pay | Admitting: Family Medicine

## 2021-03-02 MED ORDER — LISDEXAMFETAMINE DIMESYLATE 30 MG PO CAPS: 30.0000 mg | ORAL_CAPSULE | Freq: Every day | ORAL | 0 refills | Status: DC

## 2021-03-02 NOTE — Telephone Encounter (Signed)
30mg  vyvanse sent in as requested since she would like to taper down.

## 2021-04-04 ENCOUNTER — Other Ambulatory Visit: Payer: Self-pay | Admitting: Family Medicine

## 2021-04-04 MED ORDER — LISDEXAMFETAMINE DIMESYLATE 30 MG PO CAPS: 30.0000 mg | ORAL_CAPSULE | Freq: Every day | ORAL | 0 refills | Status: DC

## 2021-04-10 DIAGNOSIS — F32A Depression, unspecified: Secondary | ICD-10-CM | POA: Insufficient documentation

## 2021-04-13 ENCOUNTER — Other Ambulatory Visit: Payer: Self-pay | Admitting: Family Medicine

## 2021-05-05 ENCOUNTER — Other Ambulatory Visit: Payer: Self-pay | Admitting: Family Medicine

## 2021-05-07 MED ORDER — LISDEXAMFETAMINE DIMESYLATE 30 MG PO CAPS: 30.0000 mg | ORAL_CAPSULE | Freq: Every day | ORAL | 0 refills | Status: DC

## 2021-05-08 ENCOUNTER — Telehealth: Payer: Self-pay | Admitting: Gastroenterology

## 2021-05-08 ENCOUNTER — Other Ambulatory Visit: Payer: Self-pay

## 2021-05-08 NOTE — Telephone Encounter (Signed)
I spoke with the pt husband and he tells me that the pt has continued IBS symptoms.  At her last visit Yolanda Santana advised follow up with Dr Loletha Carrow.  She never made that appt. Looks like she was also asked to do a SIBO test.  I do not see those results and asked the pt husband to confirm if the test was completed.  She has an appt with PCP and was also set up to see Dr Loletha Carrow on 10/22.

## 2021-05-08 NOTE — Telephone Encounter (Signed)
Inbound call from patient husband Darren. Patient is experiencing abd pain, diarrhea, nausea, and lack of appetite. Would like to know what can be done to help patient as this been going on for a few months. Best contact number [phone]

## 2021-05-09 ENCOUNTER — Encounter: Payer: Self-pay | Admitting: Family Medicine

## 2021-05-09 ENCOUNTER — Ambulatory Visit: Payer: 59 | Admitting: Family Medicine

## 2021-05-09 ENCOUNTER — Other Ambulatory Visit: Payer: 59

## 2021-05-09 ENCOUNTER — Telehealth (INDEPENDENT_AMBULATORY_CARE_PROVIDER_SITE_OTHER): Payer: 59 | Admitting: Family Medicine

## 2021-05-09 DIAGNOSIS — B001 Herpesviral vesicular dermatitis: Secondary | ICD-10-CM

## 2021-05-09 DIAGNOSIS — F321 Major depressive disorder, single episode, moderate: Secondary | ICD-10-CM | POA: Diagnosis not present

## 2021-05-09 DIAGNOSIS — F988 Other specified behavioral and emotional disorders with onset usually occurring in childhood and adolescence: Secondary | ICD-10-CM | POA: Diagnosis not present

## 2021-05-09 DIAGNOSIS — M542 Cervicalgia: Secondary | ICD-10-CM

## 2021-05-09 DIAGNOSIS — Z720 Tobacco use: Secondary | ICD-10-CM

## 2021-05-09 DIAGNOSIS — R197 Diarrhea, unspecified: Secondary | ICD-10-CM

## 2021-05-09 MED ORDER — CYCLOBENZAPRINE HCL 5 MG PO TABS: 5.0000 mg | ORAL_TABLET | Freq: Three times a day (TID) | ORAL | 1 refills | Status: DC | PRN

## 2021-05-09 MED ORDER — VALACYCLOVIR HCL 1 G PO TABS: ORAL_TABLET | ORAL | 1 refills | Status: DC

## 2021-05-09 MED ORDER — BUPROPION HCL ER (XL) 150 MG PO TB24: 150.0000 mg | ORAL_TABLET | Freq: Every day | ORAL | 2 refills | Status: DC
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
## 2021-05-10 ENCOUNTER — Encounter: Payer: Self-pay | Admitting: Family Medicine

## 2021-05-10 ENCOUNTER — Other Ambulatory Visit: Payer: Self-pay | Admitting: Family Medicine

## 2021-05-10 DIAGNOSIS — R197 Diarrhea, unspecified: Secondary | ICD-10-CM

## 2021-05-10 LAB — CBC WITH DIFFERENTIAL/PLATELET
Basophils Absolute: 0.1 10*3/uL (ref 0.0–0.1)
Basophils Relative: 0.8 % (ref 0.0–3.0)
Eosinophils Absolute: 0.2 10*3/uL (ref 0.0–0.7)
Eosinophils Relative: 3.4 % (ref 0.0–5.0)
HCT: 43.4 % (ref 36.0–46.0)
Hemoglobin: 14.3 g/dL (ref 12.0–15.0)
Lymphocytes Relative: 28.4 % (ref 12.0–46.0)
Lymphs Abs: 1.9 10*3/uL (ref 0.7–4.0)
MCHC: 33 g/dL (ref 30.0–36.0)
MCV: 95.8 fl (ref 78.0–100.0)
Monocytes Absolute: 0.5 10*3/uL (ref 0.1–1.0)
Monocytes Relative: 8.1 % (ref 3.0–12.0)
Neutro Abs: 3.9 10*3/uL (ref 1.4–7.7)
Neutrophils Relative %: 59.3 % (ref 43.0–77.0)
Platelets: 260 10*3/uL (ref 150.0–400.0)
RBC: 4.53 Mil/uL (ref 3.87–5.11)
RDW: 13.6 % (ref 11.5–15.5)
WBC: 6.6 10*3/uL (ref 4.0–10.5)

## 2021-05-10 LAB — COMPREHENSIVE METABOLIC PANEL
ALT: 13 U/L (ref 0–35)
AST: 18 U/L (ref 0–37)
Albumin: 4.4 g/dL (ref 3.5–5.2)
Alkaline Phosphatase: 36 U/L — ABNORMAL LOW (ref 39–117)
BUN: 10 mg/dL (ref 6–23)
CO2: 26 mEq/L (ref 19–32)
Calcium: 9.4 mg/dL (ref 8.4–10.5)
Chloride: 104 mEq/L (ref 96–112)
Creatinine, Ser: 0.81 mg/dL (ref 0.40–1.20)
GFR: 84.24 mL/min (ref >60.00)
Glucose, Bld: 82 mg/dL (ref 70–99)
Potassium: 4.1 mEq/L (ref 3.5–5.1)
Sodium: 137 mEq/L (ref 135–145)
Total Bilirubin: 0.7 mg/dL (ref 0.2–1.2)
Total Protein: 7.1 g/dL (ref 6.0–8.3)

## 2021-05-10 LAB — MAGNESIUM: Magnesium: 1.9 mg/dL (ref 1.5–2.5)

## 2021-05-10 LAB — TSH: TSH: 1.53 u[IU]/mL (ref 0.35–5.50)

## 2021-05-10 NOTE — Addendum Note (Signed)
Addended by: Amanda Cockayne on: 05/10/2021 03:14 PM   Modules accepted: Orders

## 2021-05-21 ENCOUNTER — Other Ambulatory Visit: Payer: Self-pay | Admitting: Family Medicine

## 2021-05-21 DIAGNOSIS — Z1231 Encounter for screening mammogram for malignant neoplasm of breast: Secondary | ICD-10-CM

## 2021-05-29 LAB — GI PROFILE, STOOL, PCR

## 2021-05-29 LAB — CLOSTRIDIUM DIFFICILE BY PCR: Toxigenic C. Difficile by PCR: NEGATIVE

## 2021-06-06 ENCOUNTER — Other Ambulatory Visit: Payer: Self-pay | Admitting: Family Medicine

## 2021-06-07 ENCOUNTER — Ambulatory Visit
Admission: RE | Admit: 2021-06-07 | Discharge: 2021-06-07 | Disposition: A | Discharge status: Home / Self Care | Payer: 59 | Source: Ambulatory Visit | Attending: Family Medicine | Admitting: Family Medicine

## 2021-06-07 ENCOUNTER — Other Ambulatory Visit: Payer: Self-pay

## 2021-06-07 DIAGNOSIS — Z1231 Encounter for screening mammogram for malignant neoplasm of breast: Secondary | ICD-10-CM

## 2021-06-08 MED ORDER — LISDEXAMFETAMINE DIMESYLATE 30 MG PO CAPS: 30.0000 mg | ORAL_CAPSULE | Freq: Every day | ORAL | 0 refills | Status: DC

## 2021-06-14 ENCOUNTER — Ambulatory Visit: Payer: 59 | Admitting: Gastroenterology

## 2021-06-18 ENCOUNTER — Ambulatory Visit: Payer: 59 | Admitting: Family Medicine
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
## 2021-06-26 ENCOUNTER — Other Ambulatory Visit: Payer: Self-pay

## 2021-06-26 ENCOUNTER — Ambulatory Visit (INDEPENDENT_AMBULATORY_CARE_PROVIDER_SITE_OTHER): Payer: 59

## 2021-06-26 ENCOUNTER — Ambulatory Visit (INDEPENDENT_AMBULATORY_CARE_PROVIDER_SITE_OTHER): Payer: 59 | Admitting: Sports Medicine

## 2021-06-26 VITALS — BP 110/80 | HR 89 | Ht 65.0 in | Wt 161.0 lb

## 2021-06-26 DIAGNOSIS — M542 Cervicalgia: Secondary | ICD-10-CM

## 2021-06-26 DIAGNOSIS — M9902 Segmental and somatic dysfunction of thoracic region: Secondary | ICD-10-CM | POA: Diagnosis not present

## 2021-06-26 DIAGNOSIS — M546 Pain in thoracic spine: Secondary | ICD-10-CM

## 2021-06-26 DIAGNOSIS — M9908 Segmental and somatic dysfunction of rib cage: Secondary | ICD-10-CM

## 2021-06-26 DIAGNOSIS — M9901 Segmental and somatic dysfunction of cervical region: Secondary | ICD-10-CM | POA: Diagnosis not present

## 2021-06-26 DIAGNOSIS — S46812A Strain of other muscles, fascia and tendons at shoulder and upper arm level, left arm, initial encounter: Secondary | ICD-10-CM

## 2021-06-26 DIAGNOSIS — M9903 Segmental and somatic dysfunction of lumbar region: Secondary | ICD-10-CM

## 2021-06-26 DIAGNOSIS — M9905 Segmental and somatic dysfunction of pelvic region: Secondary | ICD-10-CM

## 2021-06-26 MED ORDER — MELOXICAM 15 MG PO TABS: 15.0000 mg | ORAL_TABLET | Freq: Every day | ORAL | 0 refills | Status: DC

## 2021-06-26 NOTE — Patient Instructions (Addendum)
Good to see you  Injections given today Tried OMT Meloxicam 15mg  daily for 2 weeks  Follow up in 2 weeks
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
## 2021-07-08 ENCOUNTER — Other Ambulatory Visit: Payer: Self-pay | Admitting: Family Medicine

## 2021-07-11 ENCOUNTER — Ambulatory Visit (HOSPITAL_COMMUNITY)
Admission: EM | Admit: 2021-07-11 | Discharge: 2021-07-11 | Disposition: A | Discharge status: Home / Self Care | Payer: 59

## 2021-07-11 DIAGNOSIS — F332 Major depressive disorder, recurrent severe without psychotic features: Secondary | ICD-10-CM

## 2021-07-11 NOTE — Progress Notes (Signed)
Yolanda Santana was given her AVS, questions answered and she was escorted to the lobby without incident.

## 2021-07-11 NOTE — ED Provider Notes (Signed)
Behavioral Health Urgent Care Medical Screening Exam  Patient Name: Yolanda Santana MRN: [id number] Date of Evaluation: 07/11/21 Chief Complaint:   Diagnosis:  Final diagnoses:  Severe episode of recurrent major depressive disorder, without psychotic features ([place])    History of Present illness: Yolanda Santana is a 50 y.o. female.  Patient presents voluntarily to [facility] behavioral health for walk-in assessment.  She is currently seeking outpatient psychiatry follow-up as she would like to "have someone to talk to."  Patient endorses feeling exhausted and depressed currently.  Recent stressors include patient's mother and father-in-law who have been visiting her home from [place] for approximately 3 weeks, left today to return home. Yolanda Santana feels she has not "had a moment to myself for three weeks."  Additional stressor includes last night her mother-in-law made her aware that her husband did not graduate from college, patient has believed that husband graduated from college throughout her marriage.  She reports feeling frustration as she is currently the primary source of income for the home and she has been attempting to support her husband as he collects disability benefits.  Yolanda Santana reports she has been diagnosed with depression since the death of her brother when she was a teenager.  She is not currently linked with outpatient psychiatry as she relocated to [facility] approximately 2 years ago and has faced lengthy wait times attempting to establish with outpatient psychiatry.  She is compliant with medications including Lexapro 10 mg daily, Wellbutrin and Vyvanse prescribed by primary care provider.  She plans to establish with outpatient psychiatry including counseling moving forward.  Patient is assessed face-to-face by nurse practitioner.  She is seated in assessment area, no acute distress.  She is alert and oriented, pleasant and cooperative during assessment.  She reports depressed  mood with congruent affect, tearful at times.   She denies suicidal and homicidal ideations.  She reports 1 previous suicide attempt at age 86 states "it was not really a suicide attempt, I was trying to get my father's attention."  She denies any history of  self-harm.  She contracts verbally for safety with this Probation officer.   She has normal speech and behavior.  She denies both auditory and visual hallucinations.  Patient is able to converse coherently with goal-directed thoughts and no distractibility or preoccupation.  She denies paranoia.  Objectively there is no evidence of psychosis/mania or delusional thinking.   She resides in [place] with her husband, she denies access to weapons.  She is employed in [employer].  She endorses alcohol use, 1 drink approximately 2-3 times per week.  She endorses marijuana use, approximately 4-5 times per week.  She reports using marijuana to address her anxiety, she shares she was prescribed medical marijuana while residing in [place].  She endorses decreased sleep and appetite.  Patient offered support and encouragement.  She gives verbal consent to speak with her husband, Yolanda Santana phone number [phone].  Spoke with patient's husband who confirms she has no access to weapons and he has no concern for patient safety.  Patient's husband reports he will assist patient in accessing outpatient psychiatry follow-up.  Psychiatric Specialty Exam  Presentation  General Appearance:Appropriate for Environment; Casual  Eye Contact:Good  Speech:Clear and Coherent; Normal Rate  Speech Volume:Normal  Handedness:Right   Mood and Affect  Mood:Depressed  Affect:Depressed; Tearful   Thought Process  Thought Processes:Coherent; Goal Directed; Linear  Descriptions of Associations:Intact  Orientation:Full (Time, Place and Person)  Thought Content:Logical; WDL    Hallucinations:None  Ideas of Reference:None  Suicidal  Thoughts:No  Homicidal Thoughts:No   Sensorium  Memory:Immediate Good; Recent Good; Remote Good  Judgment:Good  Insight:Good   Executive Functions  Concentration:Good  Attention Span:Good  [place]  Language:Good   Psychomotor Activity  Psychomotor Activity:Normal   Assets  Assets:Communication Skills; Desire for Improvement; Financial Resources/Insurance; Housing; Intimacy; Leisure Time; Physical Health; Resilience; Social Support; Talents/Skills; Transportation   Sleep  Sleep:Fair  Number of hours: No data recorded  No data recorded  Physical Exam: Physical Exam Vitals and nursing note reviewed.  Constitutional:      Appearance: Normal appearance. She is well-developed and normal weight.  HENT:     Head: Normocephalic and atraumatic.     Nose: Nose normal.  Cardiovascular:     Rate and Rhythm: Normal rate.  Pulmonary:     Effort: Pulmonary effort is normal.  Musculoskeletal:        General: Normal range of motion.     Cervical back: Normal range of motion.  Skin:    General: Skin is warm and dry.  Neurological:     Mental Status: She is alert and oriented to person, place, and time.  Psychiatric:        Attention and Perception: Attention and perception normal.        Mood and Affect: Mood is depressed. Affect is tearful.        Speech: Speech normal.        Behavior: Behavior normal. Behavior is cooperative.        Thought Content: Thought content normal.        Cognition and Memory: Cognition and memory normal.        Judgment: Judgment normal.   Review of Systems  Constitutional: Negative.   HENT: Negative.    Eyes: Negative.   Respiratory: Negative.    Cardiovascular: Negative.   Gastrointestinal: Negative.   Genitourinary: Negative.   Musculoskeletal: Negative.   Skin: Negative.   Neurological: Negative.   Endo/Heme/Allergies: Negative.   Psychiatric/Behavioral:  Positive for depression.   Blood  pressure 130/74, pulse 91, temperature 98.7 F (37.1 C), temperature source Oral, resp. rate 18, SpO2 100 %. There is no height or weight on file to calculate BMI.  Musculoskeletal: Strength & Muscle Tone: within normal limits Gait & Station: normal Patient leans: N/A   [place] [facility] Discharge Disposition for Follow up and Recommendations: Based on my evaluation the patient does not appear to have an emergency medical condition and can be discharged with resources and follow up care in outpatient services for Medication Management and Individual Therapy Patient reviewed with Dr. Serafina Mitchell. Follow-up with outpatient psychiatry, resources provided.   Lucky Rathke, FNP 07/11/2021, 5:47 PM

## 2021-07-11 NOTE — Discharge Instructions (Signed)

## 2021-07-11 NOTE — BH Assessment (Signed)
Pt presents very tearful reporting that she is "exhausted". Pt reports that her husband of 4 years has been lying to her about being a Forensic psychologist. Pt reports that husband told her that he graduated from [place] with a degree in Engineer, production. Pt reports working 40-50 hour weeks to provide for the family and her husband is on disability. Pt taking psychotropic medications prescribed by PCP, does not have outpatient services. Denies SI,HI, AVH. Reports THC use yesterday and ETOH occasionally. Pt reports needing someone to talk to about this situation.   Pt is routine.

## 2021-07-12 ENCOUNTER — Encounter: Payer: Self-pay | Admitting: Gastroenterology

## 2021-07-12 ENCOUNTER — Ambulatory Visit (INDEPENDENT_AMBULATORY_CARE_PROVIDER_SITE_OTHER): Payer: 59 | Admitting: Gastroenterology

## 2021-07-12 ENCOUNTER — Other Ambulatory Visit: Payer: Self-pay

## 2021-07-12 ENCOUNTER — Ambulatory Visit (INDEPENDENT_AMBULATORY_CARE_PROVIDER_SITE_OTHER): Payer: 59 | Admitting: Sports Medicine

## 2021-07-12 VITALS — BP 110/60 | HR 86 | Ht 65.5 in | Wt 157.0 lb

## 2021-07-12 VITALS — BP 120/70 | HR 81 | Ht 65.0 in | Wt 156.0 lb

## 2021-07-12 DIAGNOSIS — M9902 Segmental and somatic dysfunction of thoracic region: Secondary | ICD-10-CM | POA: Diagnosis not present

## 2021-07-12 DIAGNOSIS — M546 Pain in thoracic spine: Secondary | ICD-10-CM

## 2021-07-12 DIAGNOSIS — R112 Nausea with vomiting, unspecified: Secondary | ICD-10-CM

## 2021-07-12 DIAGNOSIS — G8929 Other chronic pain: Secondary | ICD-10-CM

## 2021-07-12 DIAGNOSIS — M9905 Segmental and somatic dysfunction of pelvic region: Secondary | ICD-10-CM

## 2021-07-12 DIAGNOSIS — M9903 Segmental and somatic dysfunction of lumbar region: Secondary | ICD-10-CM

## 2021-07-12 DIAGNOSIS — K529 Noninfective gastroenteritis and colitis, unspecified: Secondary | ICD-10-CM

## 2021-07-12 DIAGNOSIS — M9901 Segmental and somatic dysfunction of cervical region: Secondary | ICD-10-CM

## 2021-07-12 MED ORDER — LISDEXAMFETAMINE DIMESYLATE 30 MG PO CAPS: 30.0000 mg | ORAL_CAPSULE | Freq: Every day | ORAL | 0 refills | Status: DC

## 2021-07-12 MED ORDER — ONDANSETRON HCL 4 MG PO TABS: 4.0000 mg | ORAL_TABLET | Freq: Four times a day (QID) | ORAL | 1 refills | Status: DC | PRN

## 2021-07-12 NOTE — Progress Notes (Signed)
[facility] Progress Note  Chief Complaint: Abdominal pain, diarrhea  Subjective  History: I first saw Yolanda Santana in 2020 when she transferred care for IBS after moving down from [place].  She had upper endoscopy and colonoscopy November 2021.  Diminutive terminal ileal ulcers looking more like NSAID effect than Crohn's.  EGD for dysphagia normal.  Biopsies negative for EOE and celiac sprue She was last seen by one of our PAs on 10/18/2020 with ongoing abdominal pain and diarrhea expressing great frustration with lack of clear diagnosis and solution to her symptoms.  She had been given a SIBO breath test kit by primary care, but opted for empiric therapy with rifaximin 550 mg 3 times daily for 2 weeks.  Yolanda Santana has ongoing abdominal pain and diarrhea as well as vomiting, all of which are triggered under periods of stress.  She has been having quite a difficult time lately with personal and family stressors. Yolanda Santana was seen yesterday for emergency mental health evaluation due to depression and anxiety.  Her recent mental health difficulties are outlined extensively in that note.  She was released for outpatient follow-up.  Yolanda Santana was frustrated that that visit yesterday was not able to get her an appointment soon with a mental health provider.  She says some people not taking new patients and other providers do not have a new patient appointment until January. She was out of work for couple days due to the stress related vomiting.  Does not recall any improvement in symptoms after the antibiotic therapy noted above earlier this year. She denies rectal bleeding. ROS: Cardiovascular:  no chest pain Respiratory: no dyspnea Remainder of systems negative except as noted above She has no intention of hurting herself or others ("I love my kids and I would never do something like that") The patient's Past Medical, Family and Social History were reviewed and are on file in the EMR.  Objective:  Med  list reviewed  Current Outpatient Medications:    ALPRAZolam (XANAX) 0.5 MG tablet, Take 1 tablet (0.5 mg total) by mouth daily as needed for anxiety., Disp: 10 tablet, Rfl: 0   buPROPion (WELLBUTRIN XL) 150 MG 24 hr tablet, Take 1 tablet (150 mg total) by mouth daily., Disp: 30 tablet, Rfl: 2   Cholecalciferol (VITAMIN D-3 PO), Take 5,000 Units by mouth every other day., Disp: , Rfl:    cyclobenzaprine (FLEXERIL) 5 MG tablet, Takes one as needed, Disp: , Rfl:    escitalopram (LEXAPRO) 10 MG tablet, TAKE 1 TABLET BY MOUTH EVERY DAY, Disp: 90 tablet, Rfl: 1   estradiol (VIVELLE-DOT) 0.05 MG/24HR patch, Place 1 patch (0.05 mg total) onto the skin 2 (two) times a week., Disp: 8 patch, Rfl: 12   lisdexamfetamine (VYVANSE) 30 MG capsule, Take 1 capsule (30 mg total) by mouth daily., Disp: 30 capsule, Rfl: 0   NON FORMULARY, Number of herbs and supplements, Disp: , Rfl:    ondansetron (ZOFRAN) 4 MG tablet, Take 1 tablet (4 mg total) by mouth every 6 (six) hours as needed for nausea or vomiting., Disp: 30 tablet, Rfl: 1   progesterone (PROMETRIUM) 100 MG capsule, Take 1 capsule (100 mg total) by mouth daily., Disp: 30 capsule, Rfl: 12   valACYclovir (VALTREX) 1000 MG tablet, Take 2 tablets PO BID x 1 day at onset of symptoms, Disp: 90 tablet, Rfl: 1   Vital signs in last 24 hrs: Vitals:   07/12/21 1525  BP: 110/60  Pulse: 86   Wt Readings from  Last 3 Encounters:  07/12/21 157 lb (71.2 kg)  07/12/21 156 lb (70.8 kg)  06/26/21 161 lb (73 kg)    Physical Exam  Guarded demeanor, upset that I was late for the visit, depressed affect and often tearful during the visit.  Cardiac: RRR without murmurs, S1S2 heard, no peripheral edema Pulm: clear to auscultation bilaterally, normal RR and effort noted Abdomen: soft, no focal tenderness, with active bowel sounds. No guarding or palpable hepatosplenomegaly. Skin; warm and dry, no jaundice or  rash  Labs:   ___________________________________________ Radiologic studies: No source of symptoms seen on CT abdomen pelvis October 2020  ____________________________________________ Other:   _____________________________________________ Assessment & Plan  Assessment: Encounter Diagnoses  Name Primary?   Chronic diarrhea Yes   Nausea and vomiting in adult   No current exam findings of obstruction.  While she has had alternating constipation and diarrhea in the past, she feels that her current episodes of constipation are only when she has urgent need for diarrhea and has to hold it because she cannot stop work or what overall she is doing to use the bathroom.  She is quite clear that her symptoms are always worse under stress, and there has been quite a lot of that for her lately.  It is clear that she is having a very difficult time, feels "like a fool" over something she recently learned about her husband during a family visit and frustrated that she works so hard and can never seem to get ahead.  I asked how I could help her at this point, and we reviewed the findings on her work-up to date.  I asked if she would like antiemetic to have on hand, and she felt that would be helpful.  Zofran 4 mg every 6 hours as needed was prescribed.  She also asked if I knew any behavioral health providers might be taking new patients or be able to see her soon.  I will make an inquiry with [facility].  23 minutes were spent on this encounter (including chart review, history/exam, counseling/coordination of care, and documentation) > 50% of that time was spent on counseling and coordination of care.   Nelida Meuse III

## 2021-07-12 NOTE — Patient Instructions (Signed)
If you are age 51 or older, your body mass index should be between 23-30. Your Body mass index is 25.73 kg/m. If this is out of the aforementioned range listed, please consider follow up with your Primary Care Provider.  If you are age 51 or younger, your body mass index should be between 19-25. Your Body mass index is 25.73 kg/m. If this is out of the aformentioned range listed, please consider follow up with your Primary Care Provider.   ________________________________________________________  The [facility] providers would like to encourage you to use [facility] to communicate with providers for non-urgent requests or questions.  Due to long hold times on the telephone, sending your provider a message by [facility] may be a faster and more efficient way to get a response.  Please allow 48 business hours for a response.  Please remember that this is for non-urgent requests.  _______________________________________________________  It was a pleasure to see you today!  Thank you for trusting me with your gastrointestinal care!

## 2021-07-12 NOTE — Patient Instructions (Addendum)
Good to see you   Follow up in 4 weeks
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
## 2021-07-13 ENCOUNTER — Telehealth (HOSPITAL_COMMUNITY): Payer: Self-pay | Admitting: Family Medicine

## 2021-07-13 NOTE — BH Assessment (Signed)
Care Management - Follow Up Discharges   Writer attempted to make contact with patient today and was unsuccessful.  Writer left a HIPPA compliant voice message.   Per chart review, pt provided with outpatient resources

## 2021-07-18 ENCOUNTER — Other Ambulatory Visit: Payer: Self-pay | Admitting: *Deleted

## 2021-07-18 DIAGNOSIS — Z87891 Personal history of nicotine dependence: Secondary | ICD-10-CM

## 2021-07-18 DIAGNOSIS — F17210 Nicotine dependence, cigarettes, uncomplicated: Secondary | ICD-10-CM

## 2021-07-26 ENCOUNTER — Other Ambulatory Visit: Payer: Self-pay | Admitting: Sports Medicine

## 2021-08-03 ENCOUNTER — Other Ambulatory Visit: Payer: Self-pay | Admitting: Family Medicine

## 2021-08-11 ENCOUNTER — Other Ambulatory Visit: Payer: Self-pay | Admitting: Family Medicine

## 2021-08-13 MED ORDER — LISDEXAMFETAMINE DIMESYLATE 30 MG PO CAPS: 30.0000 mg | ORAL_CAPSULE | Freq: Every day | ORAL | 0 refills | Status: DC

## 2021-08-13 MED ORDER — ALPRAZOLAM 0.5 MG PO TABS
0.5000 mg | ORAL_TABLET | Freq: Every day | ORAL | 0 refills | Status: DC | PRN
Start: 2021-08-13 — End: 2022-04-08

## 2021-08-15 ENCOUNTER — Encounter: Payer: Self-pay | Admitting: Acute Care

## 2021-08-15 ENCOUNTER — Other Ambulatory Visit: Payer: Self-pay

## 2021-08-15 ENCOUNTER — Ambulatory Visit (INDEPENDENT_AMBULATORY_CARE_PROVIDER_SITE_OTHER)
Admission: RE | Admit: 2021-08-15 | Discharge: 2021-08-15 | Disposition: A | Discharge status: Home / Self Care | Payer: 59 | Source: Ambulatory Visit | Attending: Family Medicine | Admitting: Family Medicine

## 2021-08-15 ENCOUNTER — Telehealth (INDEPENDENT_AMBULATORY_CARE_PROVIDER_SITE_OTHER): Payer: 59 | Admitting: Acute Care

## 2021-08-15 DIAGNOSIS — F17210 Nicotine dependence, cigarettes, uncomplicated: Secondary | ICD-10-CM

## 2021-08-15 DIAGNOSIS — Z87891 Personal history of nicotine dependence: Secondary | ICD-10-CM

## 2021-08-15 NOTE — Progress Notes (Addendum)
Virtual Visit via Video Note  I connected with Yolanda Santana on 08/15/21 at  9:30 AM EST by a video enabled telemedicine application and verified that I am speaking with the correct person using two identifiers.  Location: Patient: At home Provider: [place], [place], [place], [address]    I discussed the limitations of evaluation and management by telemedicine and the availability of in person appointments. The patient expressed understanding and agreed to proceed.  Shared Decision Making Visit Lung Cancer Screening Program [id number])   Eligibility: Age 50 y.o. Pack Years Smoking History Calculation 48 pack year history (# packs/per year x # years smoked) Recent History of coughing up blood  no Unexplained weight loss? no ( >Than 15 pounds within the last 6 months ) Prior History Lung / other cancer no (Diagnosis within the last 5 years already requiring surveillance chest CT Scans). Smoking Status Current Smoker Former Smokers: Years since quit: NA  Quit Date: NA  Visit Components: Discussion included one or more decision making aids. yes Discussion included risk/benefits of screening. yes Discussion included potential follow up diagnostic testing for abnormal scans. yes Discussion included meaning and risk of over diagnosis. yes Discussion included meaning and risk of False Positives. yes Discussion included meaning of total radiation exposure. yes  Counseling Included: Importance of adherence to annual lung cancer LDCT screening. yes Impact of comorbidities on ability to participate in the program. yes Ability and willingness to under diagnostic treatment. yes  Smoking Cessation Counseling: Current Smokers:  Discussed importance of smoking cessation. yes Information about tobacco cessation classes and interventions provided to patient. yes Patient provided with "ticket" for LDCT Scan. yes Symptomatic Patient. no  Counseling NA Diagnosis Code: Tobacco Use  Z72.0 Asymptomatic Patient yes  Counseling (Intermediate counseling: > three minutes counseling) [id number] Former Smokers:  Discussed the importance of maintaining cigarette abstinence. yes Diagnosis Code: Personal History of Nicotine Dependence. S06.301 Information about tobacco cessation classes and interventions provided to patient. Yes Patient provided with "ticket" for LDCT Scan. yes Written Order for Lung Cancer Screening with LDCT placed in Epic. Yes (CT Chest Lung Cancer Screening Low Dose W/O CM) [id number] Z12.2-Screening of respiratory organs Z87.891-Personal history of nicotine dependence  I have spent 25 minutes of face to face/ virtual visit   time with Yolanda Santana discussing the risks and benefits of lung cancer screening. We viewed / discussed a power point together that explained in detail the above noted topics. We paused at intervals to allow for questions to be asked and answered to ensure understanding.We discussed that the single most powerful action that she can take to decrease her risk of developing lung cancer is to quit smoking. We discussed whether or not she is ready to commit to setting a quit date. We discussed options for tools to aid in quitting smoking including nicotine replacement therapy, non-nicotine medications, support groups, Quit Smart classes, and behavior modification. We discussed that often times setting smaller, more achievable goals, such as eliminating 1 cigarette a day for a week and then 2 cigarettes a day for a week can be helpful in slowly decreasing the number of cigarettes smoked. This allows for a sense of accomplishment as well as providing a clinical benefit. I provided  her  with smoking cessation  information  with contact information for community resources, classes, free nicotine replacement therapy, and access to mobile apps, text messaging, and on-line smoking cessation help. I have also provided  her  the office contact information in the  event she  needs to contact me, or the screening staff. We discussed the time and location of the scan, and that either Doroteo Glassman RN, Joella Prince, RN  or I will call / send a letter with the results within 24-72 hours of receiving them. The patient verbalized understanding of all of  the above and had no further questions upon leaving the office. They have my contact information in the event they have any further questions.  I spent 3 minutes counseling on smoking cessation and the health risks of continued tobacco abuse.  I explained to the patient that there has been a high incidence of coronary artery disease noted on these exams. I explained that this is a non-gated exam therefore degree or severity cannot be determined. This patient is not on statin therapy. I have asked the patient to follow-up with their PCP regarding any incidental finding of coronary artery disease and management with diet or medication as their PCP  feels is clinically indicated. The patient verbalized understanding of the above and had no further questions upon completion of the visit.      Magdalen Spatz, NP 08/15/2021

## 2021-08-15 NOTE — Patient Instructions (Signed)
Thank you for participating in the [facility]. °It was our pleasure to meet you today. °We will call you with the results of your scan within the next few days. °Your scan will be assigned a Lung RADS category score by the physicians reading the scans.  °This Lung RADS score determines follow up scanning.  °See below for description of categories, and follow up screening recommendations. °We will be in touch to schedule your follow up screening annually or based on recommendations of our providers. °We will fax a copy of your scan results to your Primary Care Physician, or the physician who referred you to the program, to ensure they have the results. °Please call the office if you have any questions or concerns regarding your scanning experience or results.  °Our office number is [phone]. °Please speak with Denise Phelps, RN. She is our Lung Cancer Screening RN. °If she is unavailable when you call, please have the office staff send her a message. She will return your call at her earliest convenience. °Remember, if your scan is normal, we will scan you annually as long as you continue to meet the criteria for the program. (Age 55-77, Current smoker or smoker who has quit within the last 15 years). °If you are a smoker, remember, quitting is the single most powerful action that you can take to decrease your risk of lung cancer and other pulmonary, breathing related problems. °We know quitting is hard, and we are here to help.  °Please let us know if there is anything we can do to help you meet your goal of quitting. °If you are a former smoker, congratulations. We are proud of you! Remain smoke free! °Remember you can refer friends or family members through the number above.  °We will screen them to make sure they meet criteria for the program. °Thank you for helping us take better care of you by participating in Lung Screening. ° °You can receive free nicotine replacement therapy  ( patches, gum or mints) by calling 1-800-QUIT NOW. Please call so we can get you on the path to becoming  a non-smoker. I know it is hard, but you can do this! ° °Lung RADS Categories: ° °Lung RADS 1: no nodules or definitely non-concerning nodules.  °Recommendation is for a repeat annual scan in 12 months. ° °Lung RADS 2:  nodules that are non-concerning in appearance and behavior with a very low likelihood of becoming an active cancer. °Recommendation is for a repeat annual scan in 12 months. ° °Lung RADS 3: nodules that are probably non-concerning , includes nodules with a low likelihood of becoming an active cancer.  Recommendation is for a 6-month repeat screening scan. Often noted after an upper respiratory illness. We will be in touch to make sure you have no questions, and to schedule your 6-month scan. ° °Lung RADS 4 A: nodules with concerning findings, recommendation is most often for a follow up scan in 3 months or additional testing based on our provider's assessment of the scan. We will be in touch to make sure you have no questions and to schedule the recommended 3 month follow up scan. ° °Lung RADS 4 B:  indicates findings that are concerning. We will be in touch with you to schedule additional diagnostic testing based on our provider's  assessment of the scan. ° °Hypnosis for smoking cessation  °[employer] °[phone] ° °Acupuncture for smoking cessation  °[facility] °[phone]  °

## 2021-08-20 NOTE — Telephone Encounter (Signed)
Judson Roch and Langley Gauss, please see pt's email. Thank you!

## 2021-08-22 ENCOUNTER — Other Ambulatory Visit: Payer: Self-pay | Admitting: Acute Care

## 2021-08-22 DIAGNOSIS — Z87891 Personal history of nicotine dependence: Secondary | ICD-10-CM

## 2021-08-22 DIAGNOSIS — F17210 Nicotine dependence, cigarettes, uncomplicated: Secondary | ICD-10-CM

## 2021-08-23 ENCOUNTER — Encounter: Payer: Self-pay | Admitting: Acute Care

## 2021-08-23 ENCOUNTER — Telehealth: Payer: Self-pay | Admitting: Acute Care

## 2021-08-23 NOTE — Telephone Encounter (Signed)
Spoke with pt and clarified that she is a former smoker with a 20 pack year smoking history.  Reviewed recent CT results with pt. Pt verbalized understanding and had no further questions.

## 2021-09-20 ENCOUNTER — Other Ambulatory Visit: Payer: Self-pay | Admitting: Family Medicine

## 2021-09-25 ENCOUNTER — Other Ambulatory Visit: Payer: Self-pay | Admitting: Family Medicine

## 2021-09-25 MED ORDER — LISDEXAMFETAMINE DIMESYLATE 30 MG PO CAPS: 30.0000 mg | ORAL_CAPSULE | Freq: Every day | ORAL | 0 refills | Status: DC

## 2021-10-02 ENCOUNTER — Other Ambulatory Visit: Payer: Self-pay | Admitting: *Deleted

## 2021-10-02 DIAGNOSIS — N951 Menopausal and female climacteric states: Secondary | ICD-10-CM

## 2021-10-02 MED ORDER — PROGESTERONE MICRONIZED 100 MG PO CAPS: 100.0000 mg | ORAL_CAPSULE | Freq: Every day | ORAL | 0 refills | Status: DC

## 2021-10-02 MED ORDER — ESTRADIOL 0.05 MG/24HR TD PTTW: 1.0000 | MEDICATED_PATCH | TRANSDERMAL | 0 refills | Status: DC

## 2021-10-02 NOTE — Telephone Encounter (Signed)
Patient is scheduled for annual exam on 10/17/21, requesting refill HRT estradiol patch and progesterone.

## 2021-10-02 NOTE — Telephone Encounter (Signed)
Patient husband called requesting refill on HRT, message sent to appointments to schedule annual exam. Last annual exam was 08/2020.

## 2021-10-16 NOTE — Progress Notes (Signed)
° °  Yolanda Santana [birth year] [id number]   History:  51 y.o. G2P2 presents for annual exam.Having some hot flashes still even with estrogen patch and progesterone nightly.  Gynecologic History No LMP recorded. Patient has had an ablation. Period Duration (Days): amenorrhea, hx ablation Contraception/Family planning:  HRT /BS/ablation Sexually active: yes Last Pap: 2020. Results were: normal Last mammogram: 2022. Results were: normal  Obstetric History OB History  Gravida Para Term Preterm AB Living  2 2       2   SAB IAB Ectopic Multiple Live Births          2    # Outcome Date GA Lbr Len/2nd Weight Sex Delivery Anes PTL Lv  2 Para           1 Para              The following portions of the patient's history were reviewed and updated as appropriate: allergies, current medications, past family history, past medical history, past social history, past surgical history, and problem list.  Review of Systems Pertinent items noted in HPI and remainder of comprehensive ROS otherwise negative.   Past medical history, past surgical history, family history and social history were all reviewed and documented in the EPIC chart.   Exam:  Vitals:   10/17/21 1038  BP: 116/60  Weight: 159 lb (72.1 kg)  Height: 5' 5.5" (1.664 m)   Body mass index is 26.06 kg/m.  General appearance:  Normal Thyroid:  Symmetrical, normal in size, without palpable masses or nodularity. Respiratory  Auscultation:  Clear without wheezing or rhonchi Cardiovascular  Auscultation:  Regular rate, without rubs, murmurs or gallops  Edema/varicosities:  Not grossly evident Abdominal  Soft,nontender, without masses, guarding or rebound.  Liver/spleen:  No organomegaly noted  Hernia:  None appreciated  Skin  Inspection:  Grossly normal Breasts: Examined lying and sitting.   Right: Without masses, retractions, nipple discharge or axillary adenopathy.   Left: Without masses, retractions, nipple discharge or  axillary adenopathy. Genitourinary   Inguinal/mons:  Normal without inguinal adenopathy  External genitalia:  Normal appearing vulva with no masses, tenderness, or lesions  BUS/Urethra/Skene's glands:  Normal without masses or exudate  Vagina:  Normal appearing with normal color and discharge, no lesions  Cervix:  Normal appearing without discharge or lesions  Uterus:  Normal in size, shape and contour.  Mobile, nontender  Adnexa/parametria:     Rt: Normal in size, without masses or tenderness.   Lt: Normal in size, without masses or tenderness.  Anus and perineum: Normal. 2 small thrombosed hemorrhoids present   Patient informed chaperone available to be present for breast and pelvic exam. Patient has requested no chaperone to be present. Patient has been advised what will be completed during breast and pelvic exam.   Assessment/Plan:   -Well woman exam -Pap due 2025 -Mammo up to date -Colonoscopy up to date -Vasomotor sx- increase patch dose to 0.75mg  twice weekly, continue progesertone, rx sent - Hemorrhoids- comfort measures reviewed    Discussed SBE, colonoscopy and DEXA screening as directed/appropriate. Recommend 140mins of exercise weekly, including weight bearing exercise. Encouraged the use of seatbelts and sunscreen. Return in 1 year for annual or as needed.   Rubbie Battiest B WHNP-BC 10:50 AM 10/17/2021

## 2021-10-17 ENCOUNTER — Encounter: Payer: Self-pay | Admitting: Radiology

## 2021-10-17 ENCOUNTER — Ambulatory Visit (INDEPENDENT_AMBULATORY_CARE_PROVIDER_SITE_OTHER): Payer: Managed Care, Other (non HMO) | Admitting: Radiology

## 2021-10-17 ENCOUNTER — Other Ambulatory Visit: Payer: Self-pay

## 2021-10-17 VITALS — BP 116/60 | Ht 65.5 in | Wt 159.0 lb

## 2021-10-17 DIAGNOSIS — Z79890 Hormone replacement therapy (postmenopausal): Secondary | ICD-10-CM

## 2021-10-17 DIAGNOSIS — N951 Menopausal and female climacteric states: Secondary | ICD-10-CM

## 2021-10-17 DIAGNOSIS — Z01419 Encounter for gynecological examination (general) (routine) without abnormal findings: Secondary | ICD-10-CM

## 2021-10-17 MED ORDER — PROGESTERONE MICRONIZED 100 MG PO CAPS: 100.0000 mg | ORAL_CAPSULE | Freq: Every day | ORAL | 4 refills | Status: DC

## 2021-10-17 MED ORDER — ESTRADIOL 0.075 MG/24HR TD PTTW: 1.0000 | MEDICATED_PATCH | TRANSDERMAL | 4 refills | Status: DC

## 2021-10-30 ENCOUNTER — Other Ambulatory Visit: Payer: Self-pay | Admitting: Family Medicine

## 2021-10-30 MED ORDER — LISDEXAMFETAMINE DIMESYLATE 30 MG PO CAPS: 30.0000 mg | ORAL_CAPSULE | Freq: Every day | ORAL | 0 refills | Status: DC

## 2021-10-30 MED ORDER — BUPROPION HCL ER (XL) 150 MG PO TB24: 150.0000 mg | ORAL_TABLET | Freq: Every day | ORAL | 0 refills | Status: DC

## 2021-12-04 ENCOUNTER — Other Ambulatory Visit: Payer: Self-pay | Admitting: Family Medicine

## 2021-12-05 ENCOUNTER — Other Ambulatory Visit: Payer: Self-pay | Admitting: Family Medicine

## 2021-12-06 MED ORDER — LISDEXAMFETAMINE DIMESYLATE 30 MG PO CAPS: 30.0000 mg | ORAL_CAPSULE | Freq: Every day | ORAL | 0 refills | Status: DC

## 2021-12-06 NOTE — Telephone Encounter (Signed)
I sent this in this morning  ?

## 2021-12-10 ENCOUNTER — Encounter: Payer: Self-pay | Admitting: Family Medicine

## 2021-12-10 ENCOUNTER — Ambulatory Visit (INDEPENDENT_AMBULATORY_CARE_PROVIDER_SITE_OTHER): Payer: Managed Care, Other (non HMO) | Admitting: Family Medicine

## 2021-12-10 VITALS — BP 92/58 | HR 75 | Temp 98.6°F | Ht 65.0 in | Wt 161.0 lb

## 2021-12-10 DIAGNOSIS — K9049 Malabsorption due to intolerance, not elsewhere classified: Secondary | ICD-10-CM

## 2021-12-10 DIAGNOSIS — K580 Irritable bowel syndrome with diarrhea: Secondary | ICD-10-CM

## 2021-12-10 DIAGNOSIS — Z23 Encounter for immunization: Secondary | ICD-10-CM | POA: Diagnosis not present

## 2021-12-10 DIAGNOSIS — Z0000 Encounter for general adult medical examination without abnormal findings: Secondary | ICD-10-CM

## 2021-12-10 DIAGNOSIS — E559 Vitamin D deficiency, unspecified: Secondary | ICD-10-CM

## 2021-12-10 DIAGNOSIS — R140 Abdominal distension (gaseous): Secondary | ICD-10-CM

## 2021-12-10 DIAGNOSIS — R109 Unspecified abdominal pain: Secondary | ICD-10-CM | POA: Diagnosis not present

## 2021-12-10 DIAGNOSIS — R5383 Other fatigue: Secondary | ICD-10-CM

## 2021-12-10 NOTE — Patient Instructions (Signed)
Dr. [place] or Dr. Marcell Anger or Dr. Jerilee Hoh.  ?
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
## 2021-12-11 ENCOUNTER — Telehealth: Payer: Self-pay | Admitting: *Deleted

## 2021-12-11 NOTE — Telephone Encounter (Signed)
-----   Message from Caren Macadam, MD sent at 12/11/2021  2:39 PM EDT ----- ?Per her gyn:  "Measuring the levels (hormonal) will not give [place] any more information, unfortunately. Management is typically based on symptom relief. If she has seen no improvement after 3 months I would recommend decreased the dosage back down." I did go ahead and order some other bloodwork for her though to complete at her convenience! She should talk with them if she wants to back down on the patch dosing.  ?

## 2021-12-29 ENCOUNTER — Other Ambulatory Visit: Payer: Self-pay | Admitting: Sports Medicine

## 2021-12-31 ENCOUNTER — Encounter: Payer: Self-pay | Admitting: Family Medicine

## 2021-12-31 DIAGNOSIS — M2550 Pain in unspecified joint: Secondary | ICD-10-CM

## 2021-12-31 DIAGNOSIS — W57XXXD Bitten or stung by nonvenomous insect and other nonvenomous arthropods, subsequent encounter: Secondary | ICD-10-CM

## 2021-12-31 DIAGNOSIS — R5383 Other fatigue: Secondary | ICD-10-CM

## 2022-01-02 ENCOUNTER — Other Ambulatory Visit (INDEPENDENT_AMBULATORY_CARE_PROVIDER_SITE_OTHER): Payer: Managed Care, Other (non HMO)

## 2022-01-02 DIAGNOSIS — R109 Unspecified abdominal pain: Secondary | ICD-10-CM

## 2022-01-02 DIAGNOSIS — K580 Irritable bowel syndrome with diarrhea: Secondary | ICD-10-CM

## 2022-01-02 DIAGNOSIS — K9049 Malabsorption due to intolerance, not elsewhere classified: Secondary | ICD-10-CM

## 2022-01-02 DIAGNOSIS — R5383 Other fatigue: Secondary | ICD-10-CM

## 2022-01-02 DIAGNOSIS — E559 Vitamin D deficiency, unspecified: Secondary | ICD-10-CM

## 2022-01-03 ENCOUNTER — Other Ambulatory Visit: Payer: Self-pay | Admitting: Family Medicine

## 2022-01-03 MED ORDER — LISDEXAMFETAMINE DIMESYLATE 40 MG PO CAPS: 40.0000 mg | ORAL_CAPSULE | Freq: Every day | ORAL | 0 refills | Status: DC

## 2022-01-04 NOTE — Telephone Encounter (Signed)
Not sure if we are able to do add on labs yet? I know there was issue with this previously.  ? ?If we are, then lyme antibodies and tsh can be added for dx of fatigue. If not, let patient know and she will have to have a second draw to get these (or just wait until next bloodwork) ?

## 2022-01-06 LAB — CBC WITH DIFFERENTIAL/PLATELET
Basophils Absolute: 0.1 10*3/uL (ref 0.0–0.2)
Basos: 1 %
EOS (ABSOLUTE): 0.2 10*3/uL (ref 0.0–0.4)
Eos: 4 %
Hematocrit: 40.9 % (ref 34.0–46.6)
Hemoglobin: 13.7 g/dL (ref 11.1–15.9)
Immature Grans (Abs): 0 10*3/uL (ref 0.0–0.1)
Immature Granulocytes: 0 %
Lymphocytes Absolute: 1.9 10*3/uL (ref 0.7–3.1)
Lymphs: 38 %
MCH: 31.6 pg (ref 26.6–33.0)
MCHC: 33.5 g/dL (ref 31.5–35.7)
MCV: 94 fL (ref 79–97)
Monocytes Absolute: 0.4 10*3/uL (ref 0.1–0.9)
Monocytes: 8 %
Neutrophils Absolute: 2.3 10*3/uL (ref 1.4–7.0)
Neutrophils: 49 %
Platelets: 256 10*3/uL (ref 150–450)
RBC: 4.34 x10E6/uL (ref 3.77–5.28)
RDW: 11.7 % (ref 11.7–15.4)
WBC: 4.9 10*3/uL (ref 3.4–10.8)

## 2022-01-06 LAB — COMPREHENSIVE METABOLIC PANEL
ALT: 15 IU/L (ref 0–32)
AST: 20 IU/L (ref 0–40)
Albumin/Globulin Ratio: 2 (ref 1.2–2.2)
Albumin: 4.3 g/dL (ref 3.8–4.9)
Alkaline Phosphatase: 46 IU/L (ref 44–121)
BUN/Creatinine Ratio: 15 (ref 9–23)
BUN: 12 mg/dL (ref 6–24)
Bilirubin Total: 0.4 mg/dL (ref 0.0–1.2)
CO2: 23 mmol/L (ref 20–29)
Calcium: 9.2 mg/dL (ref 8.7–10.2)
Chloride: 101 mmol/L (ref 96–106)
Creatinine, Ser: 0.8 mg/dL (ref 0.57–1.00)
Globulin, Total: 2.2 g/dL (ref 1.5–4.5)
Glucose: 88 mg/dL (ref 70–99)
Potassium: 4.5 mmol/L (ref 3.5–5.2)
Sodium: 138 mmol/L (ref 134–144)
Total Protein: 6.5 g/dL (ref 6.0–8.5)
eGFR: 89 mL/min/{1.73_m2} (ref >59)

## 2022-01-06 LAB — ALPHA-GAL PANEL
Allergen Lamb IgE: <0.1 kU/L
Beef IgE: <0.1 kU/L
IgE (Immunoglobulin E), Serum: 15 IU/mL (ref 6–495)
O215-IgE Alpha-Gal: <0.1 kU/L
Pork IgE: <0.1 kU/L

## 2022-01-06 LAB — VITAMIN B12: Vitamin B-12: 710 pg/mL (ref 232–1245)

## 2022-01-06 LAB — FOLATE: Folate: 13 ng/mL (ref >3.0)

## 2022-01-06 LAB — VITAMIN D 25 HYDROXY (VIT D DEFICIENCY, FRACTURES): Vit D, 25-Hydroxy: 40.7 ng/mL (ref 30.0–100.0)

## 2022-01-07 NOTE — Telephone Encounter (Signed)
Per previous message from the lab, additional tests cannot be added after the day of the lab appt.   ?

## 2022-01-14 ENCOUNTER — Encounter: Payer: Self-pay | Admitting: Family Medicine

## 2022-01-18 ENCOUNTER — Other Ambulatory Visit (INDEPENDENT_AMBULATORY_CARE_PROVIDER_SITE_OTHER): Payer: Managed Care, Other (non HMO)

## 2022-01-18 DIAGNOSIS — M2550 Pain in unspecified joint: Secondary | ICD-10-CM | POA: Diagnosis not present

## 2022-01-18 DIAGNOSIS — W57XXXD Bitten or stung by nonvenomous insect and other nonvenomous arthropods, subsequent encounter: Secondary | ICD-10-CM

## 2022-01-18 DIAGNOSIS — R5383 Other fatigue: Secondary | ICD-10-CM

## 2022-01-18 LAB — T4, FREE: Free T4: 0.68 ng/dL (ref 0.60–1.60)

## 2022-01-18 LAB — T3, FREE: T3, Free: 2.6 pg/mL (ref 2.3–4.2)

## 2022-01-18 LAB — SEDIMENTATION RATE: Sed Rate: 13 mm/hr (ref 0–30)

## 2022-01-18 LAB — C-REACTIVE PROTEIN: CRP: <1 mg/dL (ref 0.5–20.0)

## 2022-01-18 LAB — TSH: TSH: 1.49 u[IU]/mL (ref 0.35–5.50)

## 2022-01-18 NOTE — Addendum Note (Signed)
Addended by: Octavio Manns E on: 01/18/2022 10:57 AM   Modules accepted: Orders

## 2022-01-19 LAB — ANA: Anti Nuclear Antibody (ANA): NEGATIVE

## 2022-01-29 ENCOUNTER — Telehealth: Payer: Self-pay | Admitting: Family Medicine

## 2022-01-29 DIAGNOSIS — M2550 Pain in unspecified joint: Secondary | ICD-10-CM

## 2022-01-29 DIAGNOSIS — W57XXXD Bitten or stung by nonvenomous insect and other nonvenomous arthropods, subsequent encounter: Secondary | ICD-10-CM

## 2022-01-29 NOTE — Telephone Encounter (Signed)
Pt is calling and would like result of lyme disease

## 2022-01-30 ENCOUNTER — Other Ambulatory Visit: Payer: Managed Care, Other (non HMO)

## 2022-01-30 LAB — LYME DISEASE SEROLOGY W/REFLEX

## 2022-01-30 LAB — SPECIMEN STATUS REPORT

## 2022-01-30 NOTE — Telephone Encounter (Signed)
Spoke with the patient and informed her of the message below.  Lab appt was scheduled for today.

## 2022-01-30 NOTE — Telephone Encounter (Signed)
Pt is returning Yolanda Santana  call

## 2022-01-30 NOTE — Telephone Encounter (Signed)
Spoke with Janett Billow at [facility] [phone]) as the chart shows this was collected on 5/26.  Per Janett Billow, unfortunately due to an error in their lab, the Lyme disease test was overlooked on the second order page and would need to be recollected.  Left a message for the patient to return my call.

## 2022-02-01 ENCOUNTER — Other Ambulatory Visit: Payer: Managed Care, Other (non HMO)

## 2022-02-01 DIAGNOSIS — W57XXXD Bitten or stung by nonvenomous insect and other nonvenomous arthropods, subsequent encounter: Secondary | ICD-10-CM

## 2022-02-01 DIAGNOSIS — M2550 Pain in unspecified joint: Secondary | ICD-10-CM

## 2022-02-04 ENCOUNTER — Telehealth: Payer: Self-pay | Admitting: Family Medicine

## 2022-02-04 ENCOUNTER — Other Ambulatory Visit: Payer: Self-pay | Admitting: Family

## 2022-02-04 LAB — LYME DISEASE SEROLOGY W/REFLEX: Lyme Total Antibody EIA: NEGATIVE

## 2022-02-04 MED ORDER — BUPROPION HCL ER (XL) 150 MG PO TB24: 150.0000 mg | ORAL_TABLET | Freq: Every day | ORAL | 1 refills | Status: DC

## 2022-02-04 MED ORDER — ESCITALOPRAM OXALATE 10 MG PO TABS: 10.0000 mg | ORAL_TABLET | Freq: Every day | ORAL | 1 refills | Status: DC

## 2022-02-04 NOTE — Telephone Encounter (Signed)
Patient's husband called because she is out of her escitalopram (LEXAPRO) 10 MG tablet and needs a refill.     Please send to [id number]- [place], [place] - [address]:  [phone] Fax:  [phone]         Please advise

## 2022-02-12 ENCOUNTER — Other Ambulatory Visit: Payer: Self-pay | Admitting: Family

## 2022-02-12 MED ORDER — LISDEXAMFETAMINE DIMESYLATE 40 MG PO CAPS: 40.0000 mg | ORAL_CAPSULE | Freq: Every day | ORAL | 0 refills | Status: DC

## 2022-03-15 ENCOUNTER — Telehealth: Payer: Self-pay | Admitting: Family Medicine

## 2022-03-15 ENCOUNTER — Other Ambulatory Visit: Payer: Self-pay | Admitting: Family

## 2022-03-15 MED ORDER — LISDEXAMFETAMINE DIMESYLATE 40 MG PO CAPS: 40.0000 mg | ORAL_CAPSULE | Freq: Every day | ORAL | 0 refills | Status: DC

## 2022-03-15 NOTE — Telephone Encounter (Signed)
Pt is calling and needs a refill lisdexamfetamine (VYVANSE) 40 MG capsule  [id number] [place], [place] - [address] [place]:  [phone] Fax:  [phone]

## 2022-03-18 NOTE — Telephone Encounter (Signed)
Pt has been sch with dr Legrand Como for 04-08-2022

## 2022-03-18 NOTE — Telephone Encounter (Signed)
Left a detailed message at the patient's cell number with the information below.   

## 2022-03-20 IMAGING — MG DIGITAL SCREENING BILAT W/ TOMO W/ CAD
8 series · 9 of 24 positions shown · non-contrast
Comparison: Previous exam(s).

CLINICAL DATA: Screening. Mother diagnosed with breast cancer at
55. History of RIGHT breast excisional.

EXAM:
DIGITAL SCREENING BILATERAL MAMMOGRAM WITH TOMO AND CAD

[R CC synth-2D]
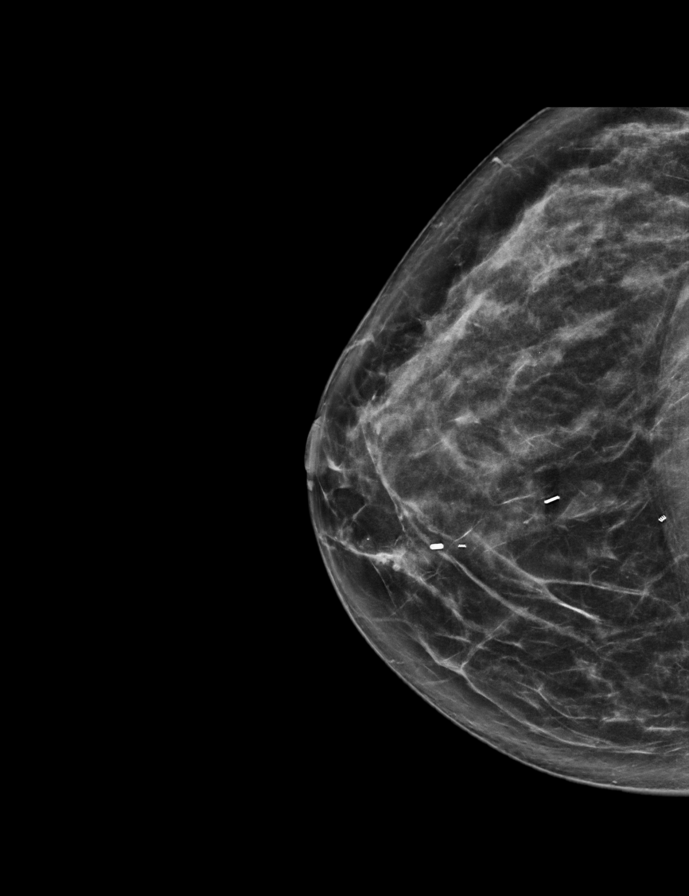

[L MLO synth-2D]
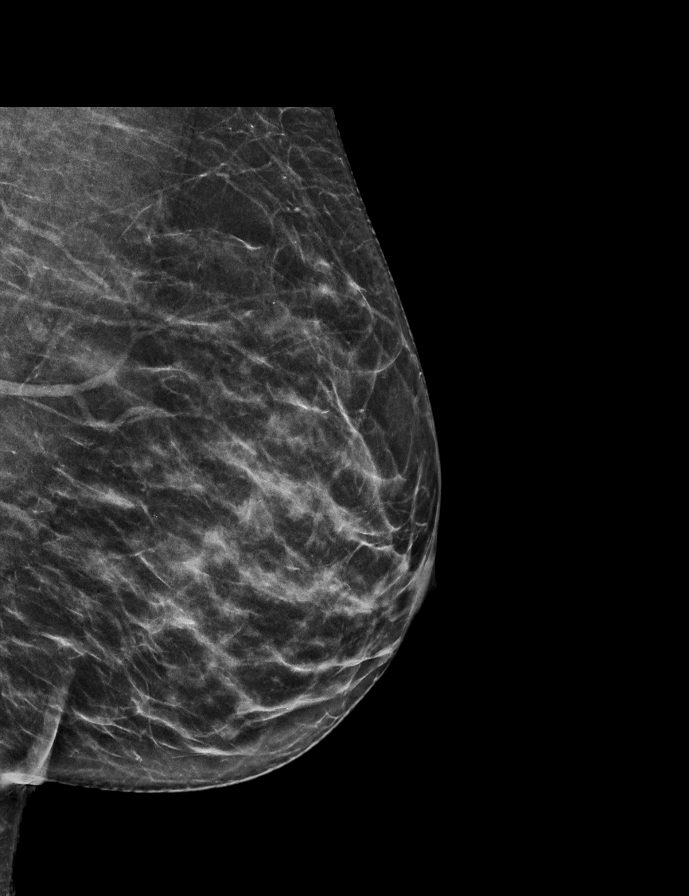

[L CC synth-2D]
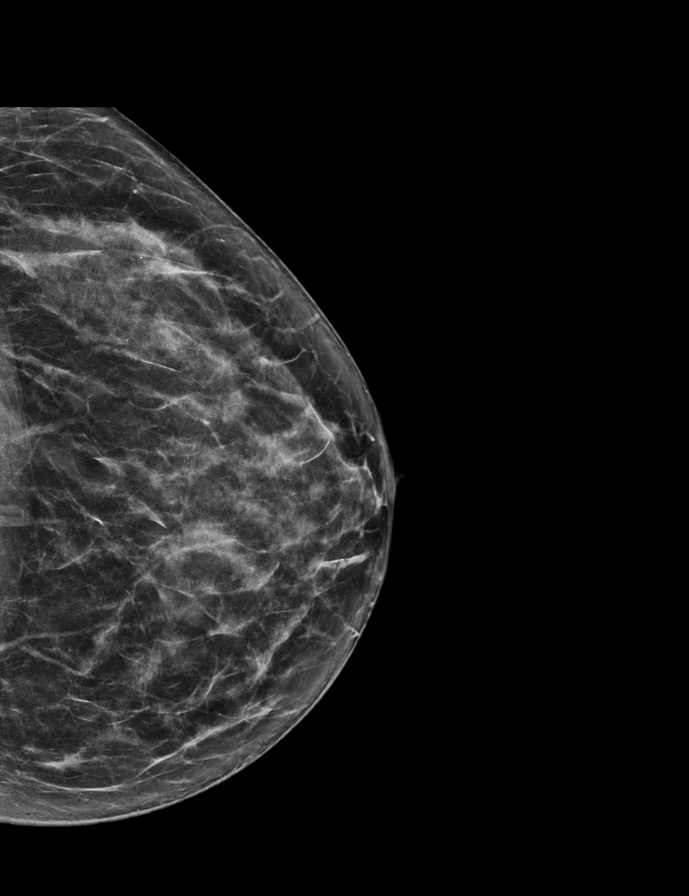

[R MLO synth-2D]
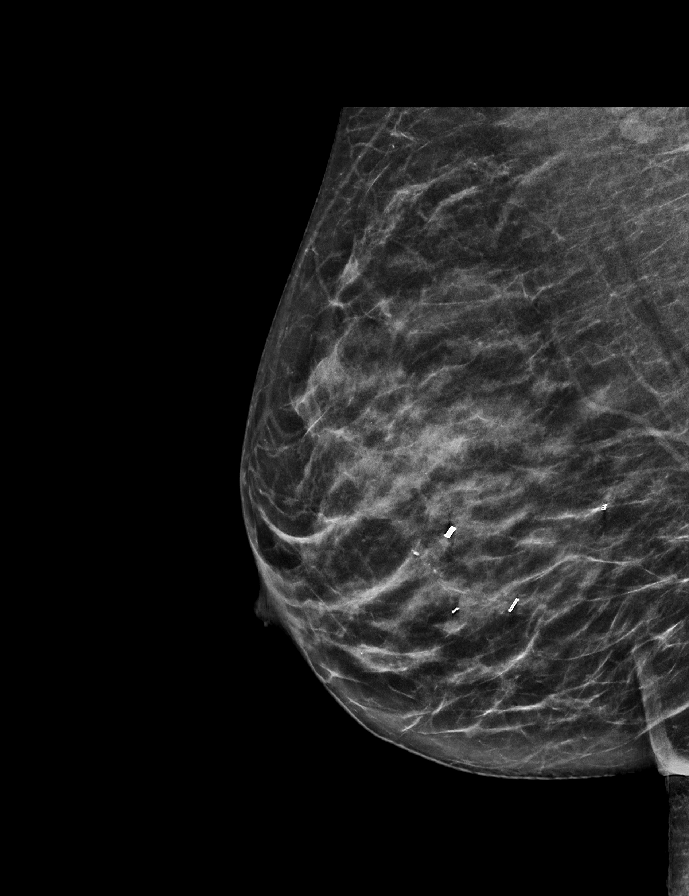

[L CC tomo · 2 of 59 frames shown]
[frame 20/59]
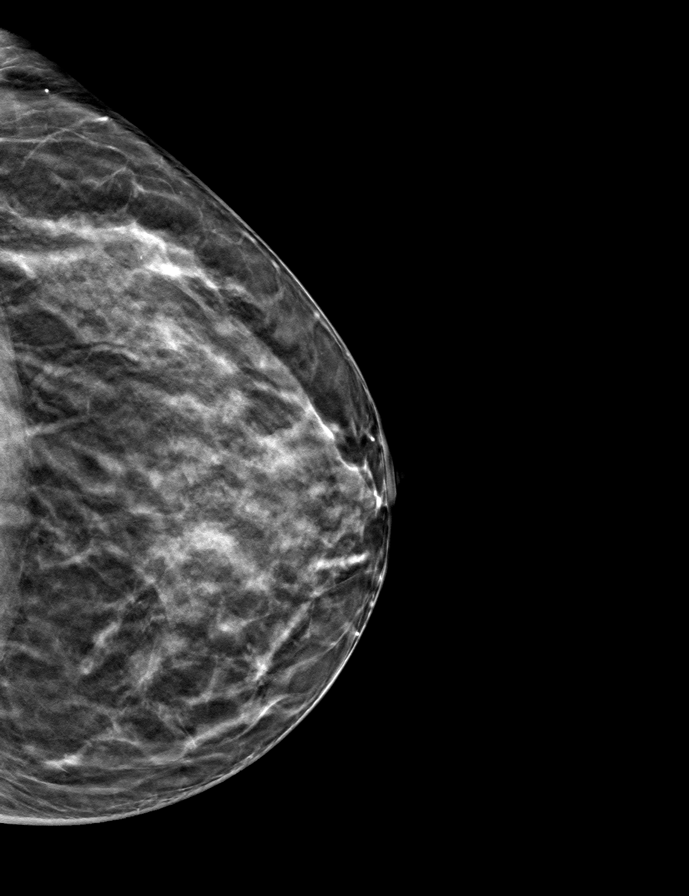
[frame 30/59]
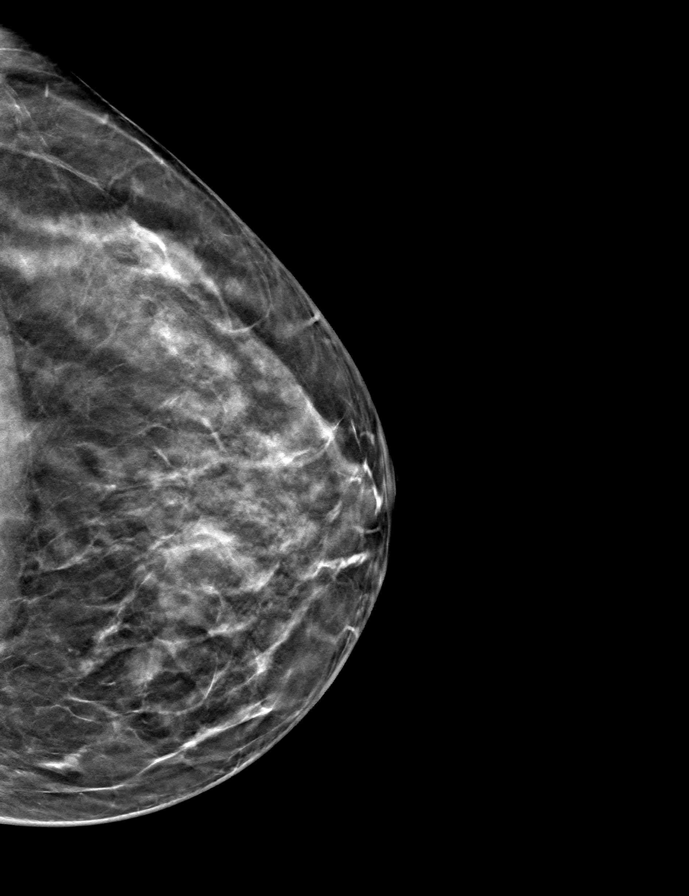

[R CC tomo · tomo slice 32/63.0]
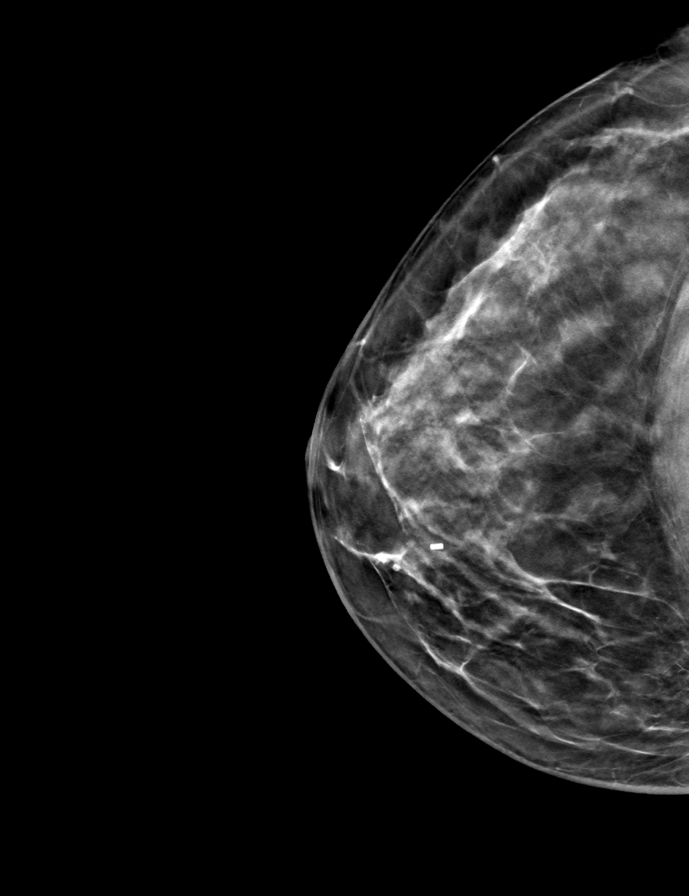

[L MLO tomo · tomo slice 29/57.0]
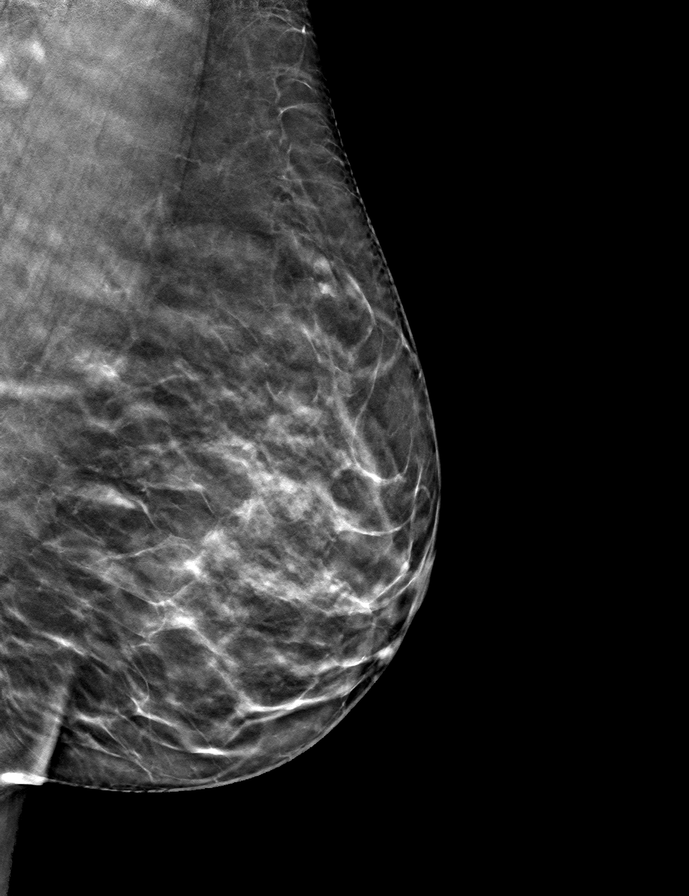

[R MLO tomo · tomo slice 29/56.0]
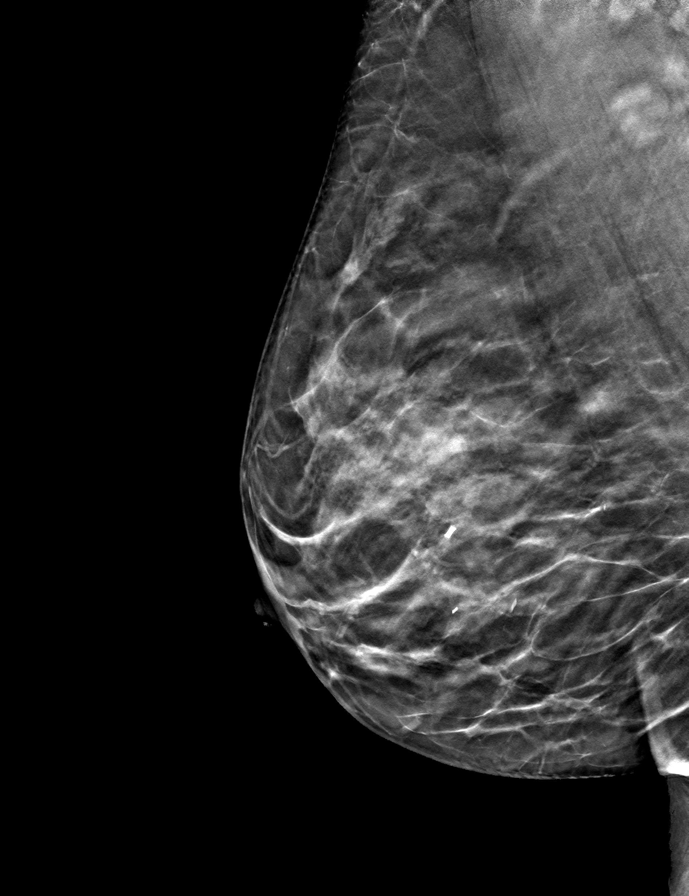

[9 of 24 positions shown; findings below may reference images not displayed]

ACR Breast Density Category c: The breast tissue is heterogeneously
dense, which may obscure small masses.
FINDINGS: There are no findings suspicious for malignancy. Images were
processed with CAD.
IMPRESSION: No mammographic evidence of malignancy. A result letter of this
screening mammogram will be mailed directly to the patient.

RECOMMENDATION:
Screening mammogram in one year. (Code:LN-[id number]-4BS)

BI-RADS CATEGORY  1: Negative.

## 2022-04-08 ENCOUNTER — Ambulatory Visit: Payer: Managed Care, Other (non HMO) | Admitting: Family Medicine

## 2022-04-08 ENCOUNTER — Encounter: Payer: Self-pay | Admitting: Family Medicine

## 2022-04-08 VITALS — BP 112/68 | HR 83 | Temp 98.8°F | Ht 65.0 in | Wt 159.3 lb

## 2022-04-08 DIAGNOSIS — F988 Other specified behavioral and emotional disorders with onset usually occurring in childhood and adolescence: Secondary | ICD-10-CM | POA: Diagnosis not present

## 2022-04-08 DIAGNOSIS — F321 Major depressive disorder, single episode, moderate: Secondary | ICD-10-CM | POA: Diagnosis not present

## 2022-04-08 MED ORDER — DEXMETHYLPHENIDATE HCL ER 20 MG PO CP24: 20.0000 mg | ORAL_CAPSULE | Freq: Every day | ORAL | 0 refills | Status: DC

## 2022-04-08 MED ORDER — ALPRAZOLAM 0.5 MG PO TABS: 0.5000 mg | ORAL_TABLET | Freq: Every day | ORAL | 0 refills | Status: DC | PRN

## 2022-04-08 NOTE — Progress Notes (Unsigned)
Established Patient Office Visit  Subjective   Patient ID: Yolanda Santana, female    DOB: 12-06-1970  Age: 51 y.o. MRN: [id number]  Chief Complaint  Patient presents with   Establish Care    Patient is here of transition of care visit. Patient reports that she has had depression since she was teenager. States that her medication helps takes edge off, states that inflation has made her finances worse. States she has a lot of anxiety over this. States the vyvanse is very expensive as it is not covered by her insurance. We discussed trying a different stimulant that is covered and she is agreeable to this plan.   Anxiety-- was started on wellbutrin 3 months ago and it seems to be helping but she is still having anxiety.  [place] Office Visit from 04/08/2022 in [facility] at [facility]  PHQ-9 Total Score: 15     PHQ was performed and reviewed with the patient. She reports that she only take the xanax once in a while when the anxiety is very bad. I reviewed her PDMP today.      Review of Systems  All other systems reviewed and are negative.     Objective:     BP 112/68 (BP Location: Left Arm, Patient Position: Sitting, Cuff Size: Large)   Pulse 83   Temp 98.8 F (37.1 C) (Oral)   Ht '5\' 5"'$  (1.651 m)   Wt 159 lb 4.8 oz (72.3 kg)   SpO2 100%   BMI 26.51 kg/m    Physical Exam Vitals reviewed.  Constitutional:      Appearance: Normal appearance. She is well-groomed and normal weight.  HENT:     Head: Normocephalic and atraumatic.  Eyes:     Extraocular Movements: Extraocular movements intact.     Pupils: Pupils are equal, round, and reactive to light.  Cardiovascular:     Rate and Rhythm: Normal rate and regular rhythm.     Heart sounds: S1 normal and S2 normal.  Pulmonary:     Effort: Pulmonary effort is normal.     Breath sounds: Normal air entry.  Musculoskeletal:        General: Normal range of motion.     Right lower leg: No edema.     Left lower leg:  No edema.  Skin:    General: Skin is warm and dry.  Neurological:     Mental Status: She is alert and oriented to person, place, and time. Mental status is at baseline.     Gait: Gait is intact.  Psychiatric:        Mood and Affect: Mood and affect normal.        Speech: Speech normal.        Behavior: Behavior normal.        Thought Content: Thought content normal.        Judgment: Judgment normal.      No results found for any visits on 04/08/22.    The ASCVD Risk score (Arnett DK, et al., 2019) failed to calculate for the following reasons:   Unable to determine if patient is Non-Hispanic African American    Assessment & Plan:   Problem List Items Addressed This Visit       Other   Depression, major, single episode, moderate ([place])    Was started on wellbutrin recently, thinks that it is helping some, we discussed other medications we could try, she wants to wait on starting any new medication for now.  I advised she only use the xanax sparingly given that it is a higher risk medication. We discussed the risks vs benefits, will give a small supply only to be taken for anxiety attacks only.      Relevant Medications   ALPRAZolam (XANAX) 0.5 MG tablet   ADD (attention deficit disorder) - Primary    Patient is agreeable to trying methylphenidate XR 20 mg capsules daily as a most cost-effective alternative to the vyvanse. I have written her 3 month supply and if she has any issues with this medication she will leave me a message. RTC every 3 months for medication refills.       Relevant Medications   dexmethylphenidate (FOCALIN XR) 20 MG 24 hr capsule   dexmethylphenidate (FOCALIN XR) 20 MG 24 hr capsule (Start on 05/08/2022)   dexmethylphenidate (FOCALIN XR) 20 MG 24 hr capsule (Start on 06/07/2022)    Return in about 3 months (around 07/09/2022) for ADHD refills, ok to schedule video visit.    Farrel Conners, MD

## 2022-04-09 NOTE — Assessment & Plan Note (Signed)
Patient is agreeable to trying methylphenidate XR 20 mg capsules daily as a most cost-effective alternative to the vyvanse. I have written her 3 month supply and if she has any issues with this medication she will leave me a message. RTC every 3 months for medication refills.

## 2022-04-09 NOTE — Assessment & Plan Note (Addendum)
Was started on wellbutrin recently, thinks that it is helping some, we discussed other medications we could try, she wants to wait on starting any new medication for now. I advised she only use the xanax sparingly given that it is a higher risk medication. We discussed the risks vs benefits, will give a small supply only to be taken for anxiety attacks only.

## 2022-04-12 ENCOUNTER — Other Ambulatory Visit: Payer: Self-pay | Admitting: Acute Care

## 2022-04-12 ENCOUNTER — Other Ambulatory Visit: Payer: Self-pay | Admitting: Pediatric Radiology

## 2022-04-12 DIAGNOSIS — Z1231 Encounter for screening mammogram for malignant neoplasm of breast: Secondary | ICD-10-CM

## 2022-04-22 ENCOUNTER — Encounter (HOSPITAL_BASED_OUTPATIENT_CLINIC_OR_DEPARTMENT_OTHER): Payer: Self-pay | Admitting: Emergency Medicine

## 2022-04-22 ENCOUNTER — Other Ambulatory Visit: Payer: Self-pay

## 2022-04-22 ENCOUNTER — Emergency Department (HOSPITAL_BASED_OUTPATIENT_CLINIC_OR_DEPARTMENT_OTHER)
Admission: EM | Admit: 2022-04-22 | Discharge: 2022-04-22 | Disposition: A | Discharge status: Home / Self Care | Payer: Managed Care, Other (non HMO) | Attending: Emergency Medicine | Admitting: Emergency Medicine

## 2022-04-22 ENCOUNTER — Emergency Department (HOSPITAL_BASED_OUTPATIENT_CLINIC_OR_DEPARTMENT_OTHER): Payer: Managed Care, Other (non HMO)

## 2022-04-22 DIAGNOSIS — K7689 Other specified diseases of liver: Secondary | ICD-10-CM | POA: Diagnosis not present

## 2022-04-22 DIAGNOSIS — M546 Pain in thoracic spine: Secondary | ICD-10-CM | POA: Insufficient documentation

## 2022-04-22 DIAGNOSIS — K769 Liver disease, unspecified: Secondary | ICD-10-CM

## 2022-04-22 LAB — CBC
HCT: 38.6 % (ref 36.0–46.0)
Hemoglobin: 12.9 g/dL (ref 12.0–15.0)
MCH: 31.8 pg (ref 26.0–34.0)
MCHC: 33.4 g/dL (ref 30.0–36.0)
MCV: 95.1 fL (ref 80.0–100.0)
Platelets: 259 10*3/uL (ref 150–400)
RBC: 4.06 MIL/uL (ref 3.87–5.11)
RDW: 13.1 % (ref 11.5–15.5)
WBC: 8.6 10*3/uL (ref 4.0–10.5)
nRBC: 0 % (ref 0.0–0.2)

## 2022-04-22 LAB — BASIC METABOLIC PANEL
Anion gap: 8 (ref 5–15)
BUN: 15 mg/dL (ref 6–20)
CO2: 26 mmol/L (ref 22–32)
Calcium: 9.8 mg/dL (ref 8.9–10.3)
Chloride: 101 mmol/L (ref 98–111)
Creatinine, Ser: 0.9 mg/dL (ref 0.44–1.00)
GFR, Estimated: >60 mL/min (ref >60)
Glucose, Bld: 108 mg/dL — ABNORMAL HIGH (ref 70–99)
Potassium: 4.5 mmol/L (ref 3.5–5.1)
Sodium: 135 mmol/L (ref 135–145)

## 2022-04-22 LAB — TROPONIN I (HIGH SENSITIVITY): Troponin I (High Sensitivity): 2 ng/L (ref <18)

## 2022-04-22 LAB — HEPATIC FUNCTION PANEL
ALT: 15 U/L (ref 0–44)
AST: 19 U/L (ref 15–41)
Albumin: 4.7 g/dL (ref 3.5–5.0)
Alkaline Phosphatase: 30 U/L — ABNORMAL LOW (ref 38–126)
Bilirubin, Direct: <0.1 mg/dL (ref 0.0–0.2)
Total Bilirubin: 0.3 mg/dL (ref 0.3–1.2)
Total Protein: 7.1 g/dL (ref 6.5–8.1)

## 2022-04-22 LAB — LIPASE, BLOOD: Lipase: 19 U/L (ref 11–51)

## 2022-04-22 MED ORDER — DIAZEPAM 5 MG PO TABS: 5.0000 mg | ORAL_TABLET | Freq: Two times a day (BID) | ORAL | 0 refills | Status: DC | PRN

## 2022-04-22 MED ORDER — IOHEXOL 350 MG/ML SOLN
100.0000 mL | Freq: Once | INTRAVENOUS | Status: AC | PRN
  Administered 2022-04-22: 100 mL via INTRAVENOUS

## 2022-04-22 MED ORDER — LIDOCAINE 5 % EX PTCH
1.0000 | MEDICATED_PATCH | CUTANEOUS | Status: DC
  Administered 2022-04-22: 1 via TRANSDERMAL
  Filled 2022-04-22: qty 1

## 2022-04-22 MED ORDER — LIDOCAINE 5 % EX PTCH: 1.0000 | MEDICATED_PATCH | CUTANEOUS | 0 refills | Status: DC

## 2022-04-22 MED ORDER — KETOROLAC TROMETHAMINE 30 MG/ML IJ SOLN
30.0000 mg | Freq: Once | INTRAMUSCULAR | Status: AC
  Administered 2022-04-22: 30 mg via INTRAVENOUS
  Filled 2022-04-22: qty 1

## 2022-04-22 MED ORDER — ALUM & MAG HYDROXIDE-SIMETH 200-200-20 MG/5ML PO SUSP: 30.0000 mL | Freq: Once | ORAL | Status: DC

## 2022-04-22 NOTE — ED Provider Notes (Signed)
[facility] EMERGENCY DEPT Provider Note   CSN: [id number] Arrival date & time: 04/22/22  1405     History  No chief complaint on file.   Yolanda Santana is a 51 y.o. female.  With PMH of depression, ADD, IBS who presents with atraumatic midthoracic back pain that has been ongoing since yesterday.  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  She feels like it was a back spasm but typically does not get these in never has had them so persistent so decided to come into the ER.  She took some ibuprofen but then started having some upper abdominal burning pain.  She has had no fevers, nausea, vomiting, diarrhea, loss of bladder or bowel, focal weakness, inability to walk, paresthesias, loss of sensation, groin paresthesias, urinary symptoms.  She has no history of IV drug use or cancer.  HPI     Home Medications Prior to Admission medications   Medication Sig Start Date End Date Taking? Authorizing Provider  diazepam (VALIUM) 5 MG tablet Take 1 tablet (5 mg total) by mouth every 12 (twelve) hours as needed for up to 8 doses for muscle spasms. 04/22/22  Yes Elgie Congo, MD  lidocaine (LIDODERM) 5 % Place 1 patch onto the skin daily. Remove & Discard patch within 12 hours or as directed by MD 04/22/22  Yes Elgie Congo, MD  ALPRAZolam Duanne Moron) 0.5 MG tablet Take 1 tablet (0.5 mg total) by mouth daily as needed for anxiety. 04/08/22   Farrel Conners, MD  buPROPion (WELLBUTRIN XL) 150 MG 24 hr tablet Take 1 tablet (150 mg total) by mouth daily. 02/04/22   Dutch Quint B, FNP  Cholecalciferol (VITAMIN D-3 PO) Take 5,000 Units by mouth every other day.    [provider]  dexmethylphenidate (FOCALIN XR) 20 MG 24 hr capsule Take 1 capsule (20 mg total) by mouth daily. 04/08/22   Farrel Conners, MD  dexmethylphenidate (FOCALIN XR) 20 MG 24 hr capsule Take 1 capsule (20 mg total) by mouth daily. 05/08/22   Farrel Conners, MD   dexmethylphenidate (FOCALIN XR) 20 MG 24 hr capsule Take 1 capsule (20 mg total) by mouth daily. 06/07/22   Farrel Conners, MD  escitalopram (LEXAPRO) 10 MG tablet Take 1 tablet (10 mg total) by mouth daily. 02/04/22   Kennyth Arnold, FNP  estradiol (VIVELLE-DOT) 0.075 MG/24HR Place 1 patch onto the skin 2 (two) times a week. 10/18/21   Chrzanowski, Jami B, NP  lisdexamfetamine (VYVANSE) 40 MG capsule Take 1 capsule (40 mg total) by mouth daily. 03/15/22 04/14/22  Kennyth Arnold, FNP  NON FORMULARY Number of herbs and supplements    [provider]  progesterone (PROMETRIUM) 100 MG capsule Take 1 capsule (100 mg total) by mouth daily. 10/17/21   Chrzanowski, Wende Crease B, NP  valACYclovir (VALTREX) 1000 MG tablet Take 2 tablets PO BID x 1 day at onset of symptoms 05/09/21   Caren Macadam, MD      Allergies    Penicillins    Review of Systems   Review of Systems  Physical Exam Updated Vital Signs BP 126/64 (BP Location: Right Arm)   Pulse 75   Temp 98.4 F (36.9 C)   Resp 18   SpO2 100%  Physical Exam Constitutional: Alert and oriented. Well appearing and in no distress. Eyes: Conjunctivae are normal. ENT      Head: Normocephalic and atraumatic.      Nose:  No congestion.      Mouth/Throat: Mucous membranes are moist.      Neck: No stridor. Cardiovascular: S1, S2,  Normal and symmetric distal pulses are present in all extremities.Warm and well perfused. Respiratory: Normal respiratory effort. Breath sounds are normal. Gastrointestinal: Soft and nontender. There is no CVA tenderness. Musculoskeletal: Midthoracic region tenderness no midline tenderness step-offs or deformities.  Normal range of motion in all extremities.      Right lower leg: No tenderness or edema.      Left lower leg: No tenderness or edema. Neurologic: Normal speech and language without aphasia. AAOx4. Face symmetrical without droop. CN II-XII intact. 5/5 strength in upper and lower extremities. Normal  sensation to light touch in all extremities.  Normal gait. No focal neurologic deficits are appreciated. Skin: Skin is warm, dry and intact. No rash noted. Psychiatric: Mood and affect are normal. Speech and behavior are normal.  ED Results / Procedures / Treatments   Labs (all labs ordered are listed, but only abnormal results are displayed) Labs Reviewed  BASIC METABOLIC PANEL - Abnormal; Notable for the following components:      Result Value   Glucose, Bld 108 (*)    All other components within normal limits  HEPATIC FUNCTION PANEL - Abnormal; Notable for the following components:   Alkaline Phosphatase 30 (*)    All other components within normal limits  CBC  LIPASE, BLOOD  TROPONIN I (HIGH SENSITIVITY)    EKG EKG Interpretation  Date/Time:  Monday April 22 2022 14:35:45 EDT Ventricular Rate:  72 PR Interval:  130 QRS Duration: 118 QT Interval:  424 QTC Calculation: 464 R Axis:   79 Text Interpretation: Normal sinus rhythm Possible Anterior infarct , age undetermined Abnormal ECG No previous ECGs available No previous ECGs available Confirmed by Georgina Snell [phone]) on 04/22/2022 4:49:18 PM  Radiology CT Angio Chest/Abd/Pel for Dissection W and/or Wo Contrast  Result Date: 04/22/2022 CLINICAL DATA:  Acute aortic syndrome suspected. Acute onset throbbing epigastric pain EXAM: CT ANGIOGRAPHY CHEST, ABDOMEN AND PELVIS TECHNIQUE: Non-contrast CT of the chest was initially obtained. Multidetector CT imaging through the chest, abdomen and pelvis was performed using the standard protocol during bolus administration of intravenous contrast. Multiplanar reconstructed images and MIPs were obtained and reviewed to evaluate the vascular anatomy. RADIATION DOSE REDUCTION: This exam was performed according to the departmental dose-optimization program which includes automated exposure control, adjustment of the mA and/or kV according to patient size and/or use of iterative  reconstruction technique. CONTRAST:  163m OMNIPAQUE IOHEXOL 350 MG/ML SOLN COMPARISON:  CT scan of the chest 08/15/2021; CT scan of the abdomen and pelvis 06/13/2019 FINDINGS: CTA CHEST FINDINGS Cardiovascular: Preferential opacification of the thoracic aorta. No evidence of thoracic aortic aneurysm or dissection. Normal heart size. No pericardial effusion. Two vessel aortic arch. The right brachiocephalic and left common carotid artery share a common origin. Mediastinum/Nodes: No enlarged mediastinal, hilar, or axillary lymph nodes. Thyroid gland, trachea, and esophagus demonstrate no significant findings. Lungs/Pleura: Lungs are clear. No pleural effusion or pneumothorax. Musculoskeletal: No chest wall abnormality. No acute or significant osseous findings. Review of the MIP images confirms the above findings. CTA ABDOMEN AND PELVIS FINDINGS VASCULAR Aorta: Normal caliber aorta without aneurysm, dissection, vasculitis or significant stenosis. Celiac: Patent without evidence of aneurysm, dissection, vasculitis or significant stenosis. SMA: Patent without evidence of aneurysm, dissection, vasculitis or significant stenosis. Renals: Both renal arteries are patent without evidence of aneurysm, dissection, vasculitis, fibromuscular dysplasia or significant stenosis. Small accessory  left renal artery. IMA: Patent without evidence of aneurysm, dissection, vasculitis or significant stenosis. Inflow: Patent without evidence of aneurysm, dissection, vasculitis or significant stenosis. Veins: No obvious venous abnormality within the limitations of this arterial phase study. Review of the MIP images confirms the above findings. NON-VASCULAR Hepatobiliary: Multiple circumscribed water attenuation cystic lesions scattered throughout the liver. Similar findings were seen on prior imaging from October 2020. Findings remain consistent with benign hepatic cysts. Gallbladder is unremarkable. No intra or extrahepatic biliary ductal  dilatation. Pancreas: Unremarkable. No pancreatic ductal dilatation or surrounding inflammatory changes. Spleen: Normal in size without focal abnormality. Adrenals/Urinary Tract: Adrenal glands are unremarkable. Kidneys are normal, without renal calculi, focal lesion, or hydronephrosis. Bladder is unremarkable. Stomach/Bowel: Stomach is within normal limits. Appendix appears normal. No evidence of bowel wall thickening, distention, or inflammatory changes. Lymphatic: No suspicious lymphadenopathy. Reproductive: Uterus and bilateral adnexa are unremarkable. Other: No abdominal wall hernia or abnormality. No abdominopelvic ascites. Musculoskeletal: No acute fracture or aggressive appearing lytic or blastic osseous lesion. Review of the MIP images confirms the above findings. IMPRESSION: 1. No evidence of aortic dissection, aneurysm or other acute arterial abnormality. 2. No acute abnormality within the chest, abdomen or pelvis. 3. Ancillary findings as above without interval change compared to prior imaging. Electronically Signed   By: Jacqulynn Cadet M.D.   On: 04/22/2022 16:43    Procedures Procedures    Medications Ordered in ED Medications  iohexol (OMNIPAQUE) 350 MG/ML injection 100 mL (100 mLs Intravenous Contrast Given 04/22/22 1620)  ketorolac (TORADOL) 30 MG/ML injection 30 mg (30 mg Intravenous Given 04/22/22 1744)    ED Course/ Medical Decision Making/ A&P                           Medical Decision Making Tailey Top is a 51 y.o. female.  With PMH of depression, ADD, IBS who presents with atraumatic midthoracic back pain that has been ongoing since yesterday.  Patient's presentation seems most consistent with nonspecific back pain such as musculoskeletal strain versus muscle spasms.  She has no risk factors, no IV drug use, no cancer history, no infectious symptoms, no abnormalities concerning for underlying spinal etiology such as discitis and no focal neurologic deficits or symptoms  concerning for a cauda equina.  A CTA of the chest abdomen and pelvis was obtained which showed no evidence of dissection or other acute abnormality.  She has no urinary symptoms and no CVA tenderness suggestive of UTI, pyelonephritis or nephrolithiasis additionally there is no evidence of nephrolithiasis on CT scan.  Her EKG showed no ischemic change and isolated troponin was 2 and reassuring unlikely atypical ACS.  I advised supportive care with Tylenol, ibuprofen, lidocaine patches and as needed Valium for muscle spasm.  Advise close follow-up with PCP as well as for follow-up for liver lesions noted on CT scan that been consistent with previous imaging suggestive of liver cyst.  Discussed strict return precautions.  She is safe for discharge home.  Amount and/or Complexity of Data Reviewed Labs: ordered. Decision-making details documented in ED Course.    Details: No leukocytosis normal white blood cell count.  No anemia hemoglobin 12.9.  No acute electrolyte abnormalities.  Creatinine 0.9 within normal limits.  Normal troponin 2.  Normal lipase not consistent with pancreatitis.  No transaminitis. ECG/medicine tests: independent interpretation performed. Decision-making details documented in ED Course.  Risk Prescription drug management.    Final Clinical Impression(s) / ED Diagnoses Final diagnoses:  Acute thoracic back pain, unspecified back pain laterality  Liver lesion    Rx / DC Orders ED Discharge Orders          Ordered    diazepam (VALIUM) 5 MG tablet  Every 12 hours PRN        04/22/22 1739    lidocaine (LIDODERM) 5 %  Every 24 hours        04/22/22 1741              Elgie Congo, MD 04/22/22 2325

## 2022-04-22 NOTE — ED Notes (Signed)
Patient verbalizes understanding of discharge instructions. Opportunity for questioning and answers were provided. Patient discharged from ED.  °

## 2022-04-22 NOTE — ED Triage Notes (Signed)
Thoracic back pain started overnight, difficult to turn over due to pain, this am at work she took advil and now has throbbing epigastric pain.

## 2022-04-22 NOTE — Discharge Instructions (Addendum)
You were seen in the emergency department today for back pain. Overall, your work-up including overall, your work-up including your CT scan of the chest abdomen and pelvis, labs and EKG were all reassuring and did not show an acute cause of your pain at this time.  There is no evidence of fractures or abnormality in the bones.  There is no evidence of pneumonia or abnormalities within the chest.   As we discussed, there were some evidence of cystic lesions in your liver which are unchanged from 2020 but so you are aware and to follow-up with your primary care physician.  Take Tylenol, Ibuprofen 600 mg every 6 hours, use Lidoderm patches and the Valium as needed for pain we believe is related to a muscle spasm.  Come back if any severe worsening pain, unrelenting pain for more than 2 weeks, new weakness in the legs, loss of sensation in the groin region, or any other symptoms concerning to you.  " Multiple circumscribed water attenuation cystic  lesions scattered throughout the liver. Similar findings were seen  on prior imaging from October 2020. Findings remain consistent with  benign hepatic cysts.  "

## 2022-04-25 ENCOUNTER — Encounter: Payer: Self-pay | Admitting: Family Medicine

## 2022-04-25 ENCOUNTER — Ambulatory Visit (INDEPENDENT_AMBULATORY_CARE_PROVIDER_SITE_OTHER): Payer: Managed Care, Other (non HMO) | Admitting: Family Medicine

## 2022-04-25 DIAGNOSIS — M546 Pain in thoracic spine: Secondary | ICD-10-CM

## 2022-04-25 LAB — POCT URINALYSIS DIPSTICK
Bilirubin, UA: NEGATIVE
Blood, UA: NEGATIVE
Glucose, UA: NEGATIVE
Ketones, UA: NEGATIVE
Leukocytes, UA: NEGATIVE
Nitrite, UA: NEGATIVE
Protein, UA: NEGATIVE
Spec Grav, UA: 1.01 (ref 1.010–1.025)
Urobilinogen, UA: 0.2 E.U./dL
pH, UA: 6 (ref 5.0–8.0)

## 2022-04-25 MED ORDER — TIZANIDINE HCL 4 MG PO TABS: 4.0000 mg | ORAL_TABLET | Freq: Four times a day (QID) | ORAL | 0 refills | Status: DC | PRN

## 2022-04-25 MED ORDER — METHYLPREDNISOLONE 4 MG PO TBPK: ORAL_TABLET | ORAL | 0 refills | Status: DC

## 2022-04-25 NOTE — Progress Notes (Signed)
Established Patient Office Visit  Subjective   Patient ID: Yolanda Santana, female    DOB: 08-08-1971  Age: 51 y.o. MRN: [id number]  Chief Complaint  Patient presents with   Follow-up    Patient presents for ER follow up and states her back feels slightly better    Patient states she had sudden onset of severe back pain, severe enough that she went to the ER for it. She reports they did a full work up and she was told she needed additional imaging. I reviewed the ER note and the CT imaging which did not show any acute findings. She reports they did not do a urinalysis so we will get one today. They recommended NSAIDS and lidocaine patches. She reports she is wearing the patches but it doesn't seem to be helping much. They also gave her valium which is not helping with the spasms.    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  Genitourinary:  Negative for dysuria, flank pain and urgency.  Musculoskeletal:  Positive for back pain.  All other systems reviewed and are negative.     Objective:     BP 110/80 (BP Location: Left Arm, Patient Position: Sitting, Cuff Size: Normal)   Pulse 72   Temp 98.2 F (36.8 C) (Oral)   Ht '5\' 5"'$  (1.651 m)   Wt 164 lb (74.4 kg)   SpO2 100%   BMI 27.29 kg/m    Physical Exam Vitals reviewed.  Constitutional:      Appearance: Normal appearance. She is well-groomed and normal weight.  Abdominal:     General:  Bowel sounds are normal.     Palpations: Abdomen is soft.  Musculoskeletal:        General: Normal range of motion.     Right lower leg: No edema.     Left lower leg: No edema.  Skin:    General: Skin is warm and dry.     Findings: No rash (no rashes seen on the back or trunk).  Neurological:     General: No focal deficit present.     Mental Status: She is alert and oriented to person, place, and time. Mental status is at baseline.     Motor: No weakness.     Gait: Gait is intact. Gait normal.  Psychiatric:        Mood and Affect: Mood and affect normal.        Speech: Speech normal.        Behavior: Behavior normal.        Judgment: Judgment normal.      Results for orders placed or performed in visit on 04/25/22  POC Urinalysis Dipstick  Result Value Ref Range   Color, UA yellow    Clarity, UA clear    Glucose, UA Negative Negative   Bilirubin, UA negative    Ketones, UA negative    Spec Grav, UA 1.010 1.010 - 1.025   Blood, UA negative  pH, UA 6.0 5.0 - 8.0   Protein, UA Negative Negative   Urobilinogen, UA 0.2 0.2 or 1.0 E.U./dL   Nitrite, UA negative    Leukocytes, UA Negative Negative   Appearance     Odor      Last CBC Lab Results  Component Value Date   WBC 8.6 04/22/2022   HGB 12.9 04/22/2022   HCT 38.6 04/22/2022   MCV 95.1 04/22/2022   MCH 31.8 04/22/2022   RDW 13.1 04/22/2022   PLT 259 01/77/9390   Last metabolic panel Lab Results  Component Value Date   GLUCOSE 108 (H) 04/22/2022   NA 135 04/22/2022   K 4.5 04/22/2022   CL 101 04/22/2022   CO2 26 04/22/2022   BUN 15 04/22/2022   CREATININE 0.90 04/22/2022   GFRNONAA >60 04/22/2022   CALCIUM 9.8 04/22/2022   PROT 7.1 04/22/2022   ALBUMIN 4.7 04/22/2022   LABGLOB 2.2 01/02/2022   AGRATIO 2.0 01/02/2022   BILITOT 0.3 04/22/2022   ALKPHOS 30 (L) 04/22/2022   AST 19 04/22/2022   ALT 15 04/22/2022   ANIONGAP 8 04/22/2022      The ASCVD Risk score (Arnett DK, et al., 2019) failed to  calculate for the following reasons:   Unable to determine if patient is Non-Hispanic African American    Assessment & Plan:   Problem List Items Addressed This Visit       Other   Acute thoracic back pain    CT imaging was reassuring, no bony lesions were identified. Her UA was performed in office today and was negative for infection. I recommended a course of medrol dose pak 4 mg tablets to help reduce inflammation, Also I advised that she stop the valium and take PRN zanaflex 4 mg tablet to help reduce the muscle spasms. Her MSK back pain should improve over time, I recommended gentle stretching when the pain starts to improve. If her pain does not improve over the next couple of weeks then I would recommend physical therapy first before deciding if she needed additional imaging.      Relevant Medications   methylPREDNISolone (MEDROL DOSEPAK) 4 MG TBPK tablet   tiZANidine (ZANAFLEX) 4 MG tablet   Other Relevant Orders   POC Urinalysis Dipstick (Completed)    No follow-ups on file.    Farrel Conners, MD

## 2022-04-30 NOTE — Assessment & Plan Note (Signed)
CT imaging was reassuring, no bony lesions were identified. Her UA was performed in office today and was negative for infection. I recommended a course of medrol dose pak 4 mg tablets to help reduce inflammation, Also I advised that she stop the valium and take PRN zanaflex 4 mg tablet to help reduce the muscle spasms. Her MSK back pain should improve over time, I recommended gentle stretching when the pain starts to improve. If her pain does not improve over the next couple of weeks then I would recommend physical therapy first before deciding if she needed additional imaging.

## 2022-06-07 ENCOUNTER — Encounter: Payer: Self-pay | Admitting: Radiology

## 2022-06-07 ENCOUNTER — Ambulatory Visit: Payer: Managed Care, Other (non HMO) | Admitting: Radiology

## 2022-06-07 VITALS — BP 120/74

## 2022-06-07 DIAGNOSIS — R102 Pelvic and perineal pain: Secondary | ICD-10-CM

## 2022-06-07 DIAGNOSIS — Z113 Encounter for screening for infections with a predominantly sexual mode of transmission: Secondary | ICD-10-CM

## 2022-06-07 NOTE — Progress Notes (Signed)
   Yolanda Santana 1970-09-02 [id number]   History:  51 y.o. G2P2 presents of pelvic pain/cramping x's 1 year after starting HRT, PCP increased her dose to see if pain improved and it did not help. Pain is more prominent in the RLQ, feels it daily, some days more intense than others.   Gynecologic History No LMP recorded. Patient has had an ablation.    Obstetric History OB History  Gravida Para Term Preterm AB Living  '2 2       2  '$ SAB IAB Ectopic Multiple Live Births          2    # Outcome Date GA Lbr Len/2nd Weight Sex Delivery Anes PTL Lv  2 Para           1 Para              The following portions of the patient's history were reviewed and updated as appropriate: allergies, current medications, past family history, past medical history, past social history, past surgical history, and problem list.  Review of Systems Pertinent items noted in HPI and remainder of comprehensive ROS otherwise negative.   Past medical history, past surgical history, family history and social history were all reviewed and documented in the EPIC chart.   Exam:  Vitals:   06/07/22 1334  BP: 120/74   There is no height or weight on file to calculate BMI.  General appearance:  Normal Abdominal  Soft,nontender, without masses, guarding or rebound.  Liver/spleen:  No organomegaly noted  Hernia:  None appreciated  Skin  Inspection:  Grossly normal  Genitourinary   Inguinal/mons:  Normal without inguinal adenopathy  External genitalia:  Normal appearing vulva with no masses, tenderness, or lesions  BUS/Urethra/Skene's glands:  Normal without masses or exudate  Vagina:  Normal appearing with normal color and discharge, no lesions  Cervix:  Normal appearing without discharge or lesions  Uterus:  Normal in size, shape and contour.  Mobile, nontender  Adnexa/parametria:     Rt: Normal in size, without masses or tenderness.   Lt: Normal in size, without masses or tenderness.  Anus and  perineum: Normal   Patient informed chaperone available to be present for breast and pelvic exam. Patient has requested no chaperone to be present. Patient has been advised what will be completed during breast and pelvic exam.   Assessment/Plan:   1. Pelvic pain - Urinalysis,Complete w/RFL Culture - US Transvaginal Non-OB; Future  2. Screening for STDs (sexually transmitted diseases) - SURESWAB CT/NG/T. vaginalis     Railynn Ballo B WHNP-BC 1:51 PM 06/07/2022

## 2022-06-09 LAB — SURESWAB CT/NG/T. VAGINALIS
C. trachomatis RNA, TMA: NOT DETECTED
N. gonorrhoeae RNA, TMA: NOT DETECTED
Trichomonas vaginalis RNA: NOT DETECTED

## 2022-06-12 ENCOUNTER — Ambulatory Visit
Admission: RE | Admit: 2022-06-12 | Discharge: 2022-06-12 | Disposition: A | Discharge status: Home / Self Care | Payer: Managed Care, Other (non HMO) | Source: Ambulatory Visit | Attending: Acute Care | Admitting: Acute Care

## 2022-06-12 DIAGNOSIS — Z1231 Encounter for screening mammogram for malignant neoplasm of breast: Secondary | ICD-10-CM

## 2022-06-13 ENCOUNTER — Encounter: Payer: Self-pay | Admitting: Family Medicine

## 2022-06-13 DIAGNOSIS — F988 Other specified behavioral and emotional disorders with onset usually occurring in childhood and adolescence: Secondary | ICD-10-CM

## 2022-06-13 NOTE — Progress Notes (Signed)
Normal mammo, follow up in 1 year

## 2022-06-14 MED ORDER — LISDEXAMFETAMINE DIMESYLATE 30 MG PO CAPS: 30.0000 mg | ORAL_CAPSULE | Freq: Every day | ORAL | 0 refills | Status: DC

## 2022-06-20 LAB — URINALYSIS, COMPLETE W/RFL CULTURE
Bacteria, UA: NONE SEEN /HPF
Bilirubin Urine: NEGATIVE
Glucose, UA: NEGATIVE
Hgb urine dipstick: NEGATIVE
Hyaline Cast: NONE SEEN /LPF
Ketones, ur: NEGATIVE
Leukocyte Esterase: NEGATIVE
Nitrites, Initial: NEGATIVE
Protein, ur: NEGATIVE
RBC / HPF: NONE SEEN /HPF (ref 0–2)
Specific Gravity, Urine: 1.02 (ref 1.001–1.035)
WBC, UA: NONE SEEN /HPF (ref 0–5)
pH: 5.5 (ref 5.0–8.0)

## 2022-06-20 LAB — NO CULTURE INDICATED

## 2022-06-21 ENCOUNTER — Encounter: Payer: Self-pay | Admitting: Gastroenterology

## 2022-07-01 ENCOUNTER — Other Ambulatory Visit: Payer: Self-pay | Admitting: Family

## 2022-07-09 ENCOUNTER — Encounter: Payer: Self-pay | Admitting: Radiology

## 2022-07-09 ENCOUNTER — Ambulatory Visit (INDEPENDENT_AMBULATORY_CARE_PROVIDER_SITE_OTHER): Payer: Managed Care, Other (non HMO)

## 2022-07-09 ENCOUNTER — Ambulatory Visit: Payer: Managed Care, Other (non HMO) | Admitting: Radiology

## 2022-07-09 VITALS — BP 118/72

## 2022-07-09 DIAGNOSIS — R1084 Generalized abdominal pain: Secondary | ICD-10-CM | POA: Diagnosis not present

## 2022-07-09 DIAGNOSIS — R102 Pelvic and perineal pain: Secondary | ICD-10-CM

## 2022-07-09 DIAGNOSIS — Z79890 Hormone replacement therapy (postmenopausal): Secondary | ICD-10-CM

## 2022-07-09 DIAGNOSIS — N83201 Unspecified ovarian cyst, right side: Secondary | ICD-10-CM

## 2022-07-09 MED ORDER — ESTRADIOL 0.1 MG/24HR TD PTTW: 1.0000 | MEDICATED_PATCH | TRANSDERMAL | 1 refills | Status: DC

## 2022-07-09 NOTE — Progress Notes (Signed)
   Yolanda Santana 1971/04/20 [id number]   History:  51 y.o. G2P2 presents for ultrasound follow up of pelvic pain/cramping x's 1 year after starting HRT, PCP increased her dose to see if pain improved and it did not help. Pain is more prominent in the RLQ, feels it daily, some days more intense than others.   Gynecologic History No LMP recorded. Patient has had an ablation.    Obstetric History OB History  Gravida Para Term Preterm AB Living  '2 2       2  '$ SAB IAB Ectopic Multiple Live Births          2    # Outcome Date GA Lbr Len/2nd Weight Sex Delivery Anes PTL Lv  2 Para           1 Para              The following portions of the patient's history were reviewed and updated as appropriate: allergies, current medications, past family history, past medical history, past social history, past surgical history, and problem list.  Review of Systems Pertinent items noted in HPI and remainder of comprehensive ROS otherwise negative.   Past medical history, past surgical history, family history and social history were all reviewed and documented in the EPIC chart.   Exam:  Vitals:   07/09/22 1536  BP: 118/72   There is no height or weight on file to calculate BMI.  U/s shows a 2.8x 2cm avascular cyst with debris and free fluid in the right adnexa Irregular endometrium c/w ablation  Assessment/Plan:   1. Hormone replacement therapy (HRT) Will increase vivelle to help with fatigue Continue dhea as prescribed by [facility] - estradiol (VIVELLE-DOT) 0.1 MG/24HR patch; Place 1 patch (0.1 mg total) onto the skin 2 (two) times a week.  Dispense: 24 patch; Refill: 1  2. Cyst of right ovary Will recheck in 3 months  3. Abdominal pain, generalized F/u with PCP for further imaging orders, pain appears to be more abdominal than pelvic     Keighan Amezcua B WHNP-BC 3:58 PM 07/09/2022

## 2022-07-15 ENCOUNTER — Encounter: Payer: Self-pay | Admitting: Family Medicine

## 2022-07-15 ENCOUNTER — Ambulatory Visit: Payer: Managed Care, Other (non HMO) | Admitting: Family Medicine

## 2022-07-15 VITALS — BP 102/72 | HR 82 | Temp 98.9°F | Wt 163.8 lb

## 2022-07-15 DIAGNOSIS — J0100 Acute maxillary sinusitis, unspecified: Secondary | ICD-10-CM

## 2022-07-15 DIAGNOSIS — R058 Other specified cough: Secondary | ICD-10-CM

## 2022-07-15 MED ORDER — LEVOFLOXACIN 500 MG PO TABS: 500.0000 mg | ORAL_TABLET | Freq: Every day | ORAL | 0 refills | Status: AC

## 2022-07-15 MED ORDER — PREDNISONE 20 MG PO TABS: ORAL_TABLET | ORAL | 0 refills | Status: DC

## 2022-07-15 MED ORDER — ALBUTEROL SULFATE HFA 108 (90 BASE) MCG/ACT IN AERS: 2.0000 | INHALATION_SPRAY | Freq: Four times a day (QID) | RESPIRATORY_TRACT | 0 refills | Status: DC | PRN
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
## 2022-07-16 ENCOUNTER — Ambulatory Visit: Payer: Managed Care, Other (non HMO) | Admitting: Family Medicine

## 2022-07-23 ENCOUNTER — Other Ambulatory Visit: Payer: Self-pay | Admitting: *Deleted

## 2022-07-23 DIAGNOSIS — B001 Herpesviral vesicular dermatitis: Secondary | ICD-10-CM

## 2022-07-24 MED ORDER — VALACYCLOVIR HCL 1 G PO TABS: ORAL_TABLET | ORAL | 1 refills | Status: DC

## 2022-07-27 ENCOUNTER — Other Ambulatory Visit: Payer: Self-pay | Admitting: Family Medicine

## 2022-07-27 DIAGNOSIS — F988 Other specified behavioral and emotional disorders with onset usually occurring in childhood and adolescence: Secondary | ICD-10-CM

## 2022-07-29 ENCOUNTER — Other Ambulatory Visit: Payer: Self-pay | Admitting: Family Medicine

## 2022-07-29 DIAGNOSIS — F988 Other specified behavioral and emotional disorders with onset usually occurring in childhood and adolescence: Secondary | ICD-10-CM

## 2022-07-29 MED ORDER — LISDEXAMFETAMINE DIMESYLATE 30 MG PO CAPS: 30.0000 mg | ORAL_CAPSULE | Freq: Every day | ORAL | 0 refills | Status: DC

## 2022-08-05 ENCOUNTER — Other Ambulatory Visit: Payer: Self-pay | Admitting: Family Medicine

## 2022-08-05 DIAGNOSIS — F988 Other specified behavioral and emotional disorders with onset usually occurring in childhood and adolescence: Secondary | ICD-10-CM

## 2022-08-06 ENCOUNTER — Other Ambulatory Visit: Payer: Self-pay | Admitting: Family Medicine

## 2022-08-07 MED ORDER — METHYLPHENIDATE HCL ER (CD) 40 MG PO CPCR: 40.0000 mg | ORAL_CAPSULE | ORAL | 0 refills | Status: DC

## 2022-08-08 ENCOUNTER — Telehealth: Payer: Self-pay | Admitting: *Deleted

## 2022-08-08 NOTE — Telephone Encounter (Signed)
Left message for pt to call back to reschedule CT . Appt for LDCT was cancelled for tomororw due to it not being a year since last CT. Will need to reschedule.

## 2022-08-09 ENCOUNTER — Other Ambulatory Visit: Payer: Self-pay | Admitting: Acute Care

## 2022-08-09 ENCOUNTER — Inpatient Hospital Stay: Admission: RE | Admit: 2022-08-09 | Payer: Managed Care, Other (non HMO) | Source: Ambulatory Visit

## 2022-08-09 DIAGNOSIS — F17210 Nicotine dependence, cigarettes, uncomplicated: Secondary | ICD-10-CM

## 2022-08-09 DIAGNOSIS — Z87891 Personal history of nicotine dependence: Secondary | ICD-10-CM

## 2022-08-09 NOTE — Telephone Encounter (Signed)
Message from patient on VM this morning.  Attempt to return call - no answer, left message that [facility] location has late evenings 5-6pm appts week of Christmas.

## 2022-08-13 ENCOUNTER — Ambulatory Visit (INDEPENDENT_AMBULATORY_CARE_PROVIDER_SITE_OTHER): Payer: Managed Care, Other (non HMO) | Admitting: Family Medicine

## 2022-08-13 ENCOUNTER — Encounter: Payer: Self-pay | Admitting: Family Medicine

## 2022-08-13 VITALS — BP 116/60 | HR 71 | Temp 98.9°F | Ht 65.0 in | Wt 164.6 lb

## 2022-08-13 DIAGNOSIS — F988 Other specified behavioral and emotional disorders with onset usually occurring in childhood and adolescence: Secondary | ICD-10-CM

## 2022-08-13 DIAGNOSIS — F32A Depression, unspecified: Secondary | ICD-10-CM | POA: Diagnosis not present

## 2022-08-13 MED ORDER — LISDEXAMFETAMINE DIMESYLATE 40 MG PO CAPS: 40.0000 mg | ORAL_CAPSULE | ORAL | 0 refills | Status: DC

## 2022-08-13 MED ORDER — ESCITALOPRAM OXALATE 10 MG PO TABS: 10.0000 mg | ORAL_TABLET | Freq: Every day | ORAL | 3 refills | Status: DC

## 2022-08-13 NOTE — Progress Notes (Signed)
Established Patient Office Visit  Subjective   Patient ID: Yolanda Santana, female    DOB: May 14, 1971  Age: 51 y.o. MRN: [id number]  Chief Complaint  Patient presents with   Medication Refill    Patient states the prescription for Metadate caused a headache, discontinued after 3 days, also requests a 90-day supply on Escitalopram '10mg'$     Patient is here for medication refills. She reports that she could not tolerate the methylphenidate. States that is caused a severe headache. States that she would like to go back to taking the vyvanse for her ADHD. Patient reports that she is having increasing fatigue and tiredness. Thinks that this is related to her depression, states that her mood is stable but she has trouble often with her sleep quality- - states she has multiple nighttime awakenings, sometimes with abnormal dreams.    Current Outpatient Medications  Medication Instructions   Cholecalciferol (VITAMIN D-3 PO) 5,000 Units, Oral, Every other day   escitalopram (LEXAPRO) 10 mg, Oral, Daily   estradiol (VIVELLE-DOT) 0.1 mg, Transdermal, 2 times weekly   lisdexamfetamine (VYVANSE) 40 mg, Oral, BH-each morning   [START ON 09/12/2022] lisdexamfetamine (VYVANSE) 40 mg, Oral, BH-each morning   [START ON 10/12/2022] lisdexamfetamine (VYVANSE) 40 mg, Oral, BH-each morning   NON FORMULARY Number of herbs and supplements    NP Thyroid 15 mg, Oral, Every morning   progesterone (PROMETRIUM) 200 mg, Oral, Daily at bedtime   tiZANidine (ZANAFLEX) 4 mg, Oral, Every 6 hours PRN   valACYclovir (VALTREX) 1000 MG tablet Take 2 tablets PO BID x 1 day at onset of symptoms     Patient Active Problem List   Diagnosis Date Noted   Acute thoracic back pain 04/25/2022   Depressive disorder 04/10/2021   Abdominal pain, generalized 10/18/2020   Irritable bowel syndrome with diarrhea 10/18/2020   Allergic rhinitis 08/23/2020   Ear itching 05/05/2020   Neck pain on left side 05/05/2020   Sensorineural hearing  loss (SNHL) of left ear with unrestricted hearing of right ear 05/05/2020   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     Objective:     BP 116/60 (BP Location: Left Arm, Patient Position: Sitting, Cuff Size: Large)   Pulse 71   Temp 98.9 F (37.2 C) (Oral)   Ht '5\' 5"'$  (1.651 m)   Wt 164 lb 9.6 oz (74.7 kg)   SpO2 99%   BMI 27.39 kg/m    Physical Exam Vitals reviewed.  Constitutional:      Appearance: Normal appearance. She is well-groomed and normal weight.  Eyes:     Conjunctiva/sclera: Conjunctivae normal.  Neck:     Thyroid: No thyromegaly.  Cardiovascular:     Rate and Rhythm: Normal rate and regular rhythm.     Pulses: Normal pulses.     Heart sounds: S1 normal and S2 normal.  Pulmonary:     Effort: Pulmonary effort is normal.     Breath sounds: Normal breath sounds and air entry.  Abdominal:     General: Bowel sounds are normal.  Musculoskeletal:     Right lower leg: No edema.     Left lower leg: No edema.  Neurological:  Mental Status: She is alert and oriented to person, place, and time. Mental status is at baseline.     Gait: Gait is intact.  Psychiatric:        Mood and Affect: Mood and affect normal.        Speech: Speech normal.        Behavior: Behavior normal.        Judgment: Judgment normal.      No results found for any visits on 08/13/22.    The ASCVD Risk score (Arnett DK, et al., 2019) failed to calculate for the following reasons:   Unable to determine if patient is Non-Hispanic African American    Assessment & Plan:   Problem List Items Addressed This Visit       Unprioritized    ADD (attention deficit disorder) - Primary    Will refill her vyvanse for 3 months. Symptoms are well controlled with this medication, continue as prescribed, 40 mg daily.      Relevant Medications   lisdexamfetamine (VYVANSE) 40 MG capsule   lisdexamfetamine (VYVANSE) 40 MG capsule (Start on 09/12/2022)   lisdexamfetamine (VYVANSE) 40 MG capsule (Start on 10/12/2022)   Depressive disorder    Pt is reporting increasing fatigue and sleepiness, admits that her sleep at night is somewhat fragmented due to having interrupted sleep, this could represent a seasonal worsening of her depression symptoms, however pt reports no changes in mood/sadness. Will continue her 10 mg lexapro daily for now.       Relevant Medications   escitalopram (LEXAPRO) 10 MG tablet    Return in about 3 months (around 11/12/2022) for video vist for med refills. Farrel Conners, MD

## 2022-08-15 ENCOUNTER — Ambulatory Visit (HOSPITAL_BASED_OUTPATIENT_CLINIC_OR_DEPARTMENT_OTHER): Admission: RE | Admit: 2022-08-15 | Payer: Managed Care, Other (non HMO) | Source: Ambulatory Visit

## 2022-08-16 NOTE — Assessment & Plan Note (Signed)
Will refill her vyvanse for 3 months. Symptoms are well controlled with this medication, continue as prescribed, 40 mg daily.

## 2022-08-16 NOTE — Assessment & Plan Note (Signed)
Pt is reporting increasing fatigue and sleepiness, admits that her sleep at night is somewhat fragmented due to having interrupted sleep, this could represent a seasonal worsening of her depression symptoms, however pt reports no changes in mood/sadness. Will continue her 10 mg lexapro daily for now.

## 2022-08-20 ENCOUNTER — Other Ambulatory Visit: Payer: Self-pay | Admitting: Family Medicine

## 2022-08-20 DIAGNOSIS — B001 Herpesviral vesicular dermatitis: Secondary | ICD-10-CM

## 2022-08-22 ENCOUNTER — Ambulatory Visit
Admission: RE | Admit: 2022-08-22 | Discharge: 2022-08-22 | Disposition: A | Discharge status: Home / Self Care | Payer: Managed Care, Other (non HMO) | Source: Ambulatory Visit | Attending: Acute Care | Admitting: Acute Care

## 2022-08-22 ENCOUNTER — Telehealth: Payer: Self-pay | Admitting: Family Medicine

## 2022-08-22 DIAGNOSIS — F17210 Nicotine dependence, cigarettes, uncomplicated: Secondary | ICD-10-CM

## 2022-08-22 DIAGNOSIS — F988 Other specified behavioral and emotional disorders with onset usually occurring in childhood and adolescence: Secondary | ICD-10-CM

## 2022-08-22 DIAGNOSIS — Z87891 Personal history of nicotine dependence: Secondary | ICD-10-CM

## 2022-08-22 NOTE — Telephone Encounter (Signed)
Pt is calling and she need rx lisdexamfetamine (VYVANSE) 40 MG capsule [facility] [phone] - [place], [place] AT [place] Phone: [phone]  Fax: [phone]  Pt is out of med

## 2022-08-23 MED ORDER — LISDEXAMFETAMINE DIMESYLATE 40 MG PO CAPS: 40.0000 mg | ORAL_CAPSULE | ORAL | 0 refills | Status: DC

## 2022-08-23 NOTE — Telephone Encounter (Signed)
Pt is checking on the status of this refill. Stated she really needs it and she knows the Dr is out she is hoping if someone can pick it up and fill it.   Please advise.

## 2022-08-23 NOTE — Telephone Encounter (Signed)
Done

## 2022-08-23 NOTE — Addendum Note (Signed)
Addended by: Alysia Penna A on: 08/23/2022 04:38 PM   Modules accepted: Orders

## 2022-08-23 NOTE — Telephone Encounter (Signed)
Medication [facility] [address] with future refills on 09/12/22 & 10/09/22  Pt notified of above & is requesting medication be sent to [facility] on [address].

## 2022-08-27 ENCOUNTER — Other Ambulatory Visit: Payer: Managed Care, Other (non HMO)

## 2022-08-27 ENCOUNTER — Other Ambulatory Visit: Payer: Self-pay | Admitting: Acute Care

## 2022-08-27 DIAGNOSIS — F17210 Nicotine dependence, cigarettes, uncomplicated: Secondary | ICD-10-CM

## 2022-08-27 DIAGNOSIS — Z122 Encounter for screening for malignant neoplasm of respiratory organs: Secondary | ICD-10-CM

## 2022-08-27 DIAGNOSIS — Z87891 Personal history of nicotine dependence: Secondary | ICD-10-CM

## 2022-09-25 ENCOUNTER — Other Ambulatory Visit: Payer: Self-pay | Admitting: Family Medicine

## 2022-09-25 DIAGNOSIS — F988 Other specified behavioral and emotional disorders with onset usually occurring in childhood and adolescence: Secondary | ICD-10-CM

## 2022-09-25 MED ORDER — AMPHETAMINE-DEXTROAMPHET ER 20 MG PO CP24: 20.0000 mg | ORAL_CAPSULE | ORAL | 0 refills | Status: DC

## 2022-09-30 ENCOUNTER — Telehealth: Payer: Self-pay | Admitting: Gastroenterology

## 2022-09-30 NOTE — Telephone Encounter (Signed)
Inbound call from patient requesting a call back from the nurse to discuss some pains she is having and " outpouching" from her rectum. Please advise.

## 2022-09-30 NOTE — Telephone Encounter (Signed)
Returned call to patient. She reports that she noticed a rectal protrusion for the last couple of days. Pt reports rectal discomfort as well. Pt states that her stool is "different", they are softer. Pt denies any diarrhea or rectal bleeding. Pt states that she has never been told that she has hemorrhoids, I told pt that this could be a possibility but she will need an appt for assessment so that the area can be visualized. I offered patient an appt this week but she has to work so she can't take the appt. She was scheduled on her next available day off, 10/09/22 at 3:40 pm. In the interim, I advised patient to try doing sitz baths and use preparation H suppositories OTC. Pt verbalized understanding and had no concerns at the end of the call.

## 2022-10-02 ENCOUNTER — Ambulatory Visit: Payer: Managed Care, Other (non HMO) | Admitting: Gastroenterology

## 2022-10-09 ENCOUNTER — Encounter: Payer: Self-pay | Admitting: Gastroenterology

## 2022-10-09 ENCOUNTER — Ambulatory Visit (INDEPENDENT_AMBULATORY_CARE_PROVIDER_SITE_OTHER): Payer: Managed Care, Other (non HMO) | Admitting: Gastroenterology

## 2022-10-09 VITALS — BP 118/68 | HR 97 | Ht 65.0 in | Wt 160.0 lb

## 2022-10-09 DIAGNOSIS — K582 Mixed irritable bowel syndrome: Secondary | ICD-10-CM

## 2022-10-09 DIAGNOSIS — K625 Hemorrhage of anus and rectum: Secondary | ICD-10-CM

## 2022-10-09 DIAGNOSIS — K648 Other hemorrhoids: Secondary | ICD-10-CM | POA: Diagnosis not present

## 2022-10-09 DIAGNOSIS — K623 Rectal prolapse: Secondary | ICD-10-CM | POA: Diagnosis not present

## 2022-10-09 MED ORDER — NA SULFATE-K SULFATE-MG SULF 17.5-3.13-1.6 GM/177ML PO SOLN: 1.0000 | Freq: Once | ORAL | 0 refills | Status: AC
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
## 2022-10-09 NOTE — Patient Instructions (Addendum)
_______________________________________________________  If your blood pressure at your visit was 140/90 or greater, please contact your primary care physician to follow up on this.  _______________________________________________________  If you are age 53 or older, your body mass index should be between 23-30. Your Body mass index is 26.63 kg/m. If this is out of the aforementioned range listed, please consider follow up with your Primary Care Provider.  If you are age 28 or younger, your body mass index should be between 19-25. Your Body mass index is 26.63 kg/m. If this is out of the aformentioned range listed, please consider follow up with your Primary Care Provider.   ________________________________________________________  The [facility] providers would like to encourage you to use [facility] to communicate with providers for non-urgent requests or questions.  Due to long hold times on the telephone, sending your provider a message by [facility] may be a faster and more efficient way to get a response.  Please allow 48 business hours for a response.  Please remember that this is for non-urgent requests.  _______________________________________________________  Dennis Bast have been scheduled for a colonoscopy. Please follow written instructions given to you at your visit today.  Please pick up your prep supplies at the pharmacy within the next 1-3 days. If you use inhalers (even only as needed), please bring them with you on the day of your procedure.   Due to recent changes in healthcare laws, you may see the results of your imaging and laboratory studies on MyChart before your provider has had a chance to review them.  We understand that in some cases there may be results that are confusing or concerning to you. Not all laboratory results come back in the same time frame and the provider may be waiting for multiple results in order to interpret others.  Please give [place] 48 hours in order for your  provider to thoroughly review all the results before contacting the office for clarification of your results.   We have sent your records to [facility].  [facility] is located at [address], [address]. They will reach out to you to schedule, please contact them at [phone] if you don't hear from them. It was a pleasure to see you today!  Thank you for trusting me with your gastrointestinal care!

## 2022-10-28 ENCOUNTER — Telehealth: Payer: Self-pay

## 2022-10-28 ENCOUNTER — Telehealth: Payer: Self-pay | Admitting: Family Medicine

## 2022-10-28 DIAGNOSIS — Z79890 Hormone replacement therapy (postmenopausal): Secondary | ICD-10-CM

## 2022-10-28 DIAGNOSIS — F32A Depression, unspecified: Secondary | ICD-10-CM

## 2022-10-28 MED ORDER — ESCITALOPRAM OXALATE 10 MG PO TABS: 10.0000 mg | ORAL_TABLET | Freq: Every day | ORAL | 0 refills | Status: DC

## 2022-10-28 NOTE — Telephone Encounter (Signed)
Pt is out of town and only took 3 pills with her and would need escitalopram (LEXAPRO) 10 MG tablet amphetamine-dextroamphetamine (ADDERALL XR) 20 MG 24 hr [id number] [place] [place] [address] [phone] Fax: [phone]

## 2022-10-28 NOTE — Telephone Encounter (Signed)
Patient went out of town for unexpected emergency and only took 3 days of Rx's with her.  MIL now on hospice and she is POA and will not be leaving like she expected.  She needs her estrogen patch and progesterone pill sent to pharmacy there in [place], [place]. Requests 30 days supply of each since she may have to pay out of pocket for it. Insurance may consider too early to fill.

## 2022-10-28 NOTE — Telephone Encounter (Signed)
Spoke with the patient and informed her of the message below.  Patient is aware the Rx for Lexapro was sent, requested a refill on NP thyoid also and was advised to contact the prescriber of NP thyroid for refills as this was not prescribed by Dr Legrand Como previously.

## 2022-10-28 NOTE — Telephone Encounter (Signed)
Ok to send the lexapro, however I cannot send a controlled substance across state lines

## 2022-10-29 ENCOUNTER — Encounter: Payer: Managed Care, Other (non HMO) | Admitting: Gastroenterology

## 2022-10-29 MED ORDER — PROGESTERONE 200 MG PO CAPS: 200.0000 mg | ORAL_CAPSULE | Freq: Every day | ORAL | 0 refills | Status: DC

## 2022-10-29 MED ORDER — ESTRADIOL 0.1 MG/24HR TD PTTW: 1.0000 | MEDICATED_PATCH | TRANSDERMAL | 0 refills | Status: DC

## 2022-11-15 ENCOUNTER — Telehealth: Payer: Self-pay | Admitting: Family Medicine

## 2022-11-15 DIAGNOSIS — F988 Other specified behavioral and emotional disorders with onset usually occurring in childhood and adolescence: Secondary | ICD-10-CM

## 2022-11-15 MED ORDER — AMPHETAMINE-DEXTROAMPHET ER 20 MG PO CP24: 20.0000 mg | ORAL_CAPSULE | ORAL | 0 refills | Status: DC

## 2022-11-15 NOTE — Telephone Encounter (Signed)
Prescription Request  11/15/2022  LOV: 08/13/2022  What is the name of the medication or equipment?  amphetamine-dextroamphetamine (ADDERALL XR) 20 MG 24 hr capsule  Pt states she is completely out of this medication.   Have you contacted your pharmacy to request a refill? Yes   Pt called pharmacy and was told they are almost out of this Rx.   Pt is asking if MD could please send ASAP?  Which pharmacy would you like this sent to?   [id number] [place], [place] [address] AT [address] Phone: [phone]  Fax: [phone]       Patient notified that their request is being sent to the clinical staff for review and that they should receive a response within 2 business days.   Please advise at Mobile [phone] (mobile)

## 2022-11-22 ENCOUNTER — Other Ambulatory Visit: Payer: Self-pay | Admitting: Radiology

## 2022-11-25 NOTE — Telephone Encounter (Signed)
Med refill request: Vivelle-Dot, refill for out of town pharmacy, other prescription is current Last AEX: Next AEX: Last MMG (if hormonal med) 06/12/22 Refill authorized: Please Advise?

## 2022-11-26 MED ORDER — ESTRADIOL 0.1 MG/24HR TD PTTW: 1.0000 | MEDICATED_PATCH | TRANSDERMAL | 0 refills | Status: DC

## 2022-11-26 NOTE — Telephone Encounter (Signed)
Ok to refill, caring for MIL on hospice in [place]

## 2022-11-26 NOTE — Addendum Note (Signed)
Addended by: Derrill Memo on: 11/26/2022 01:16 PM   Modules accepted: Orders

## 2022-12-11 ENCOUNTER — Other Ambulatory Visit: Payer: Self-pay | Admitting: Family Medicine

## 2022-12-11 ENCOUNTER — Other Ambulatory Visit: Payer: Self-pay | Admitting: Radiology

## 2022-12-11 DIAGNOSIS — F988 Other specified behavioral and emotional disorders with onset usually occurring in childhood and adolescence: Secondary | ICD-10-CM

## 2022-12-11 DIAGNOSIS — Z79890 Hormone replacement therapy (postmenopausal): Secondary | ICD-10-CM

## 2022-12-11 DIAGNOSIS — F32A Depression, unspecified: Secondary | ICD-10-CM

## 2022-12-11 DIAGNOSIS — N951 Menopausal and female climacteric states: Secondary | ICD-10-CM

## 2022-12-11 MED ORDER — AMPHETAMINE-DEXTROAMPHET ER 20 MG PO CP24: 20.0000 mg | ORAL_CAPSULE | ORAL | 0 refills | Status: DC

## 2022-12-11 MED ORDER — ESCITALOPRAM OXALATE 10 MG PO TABS: 10.0000 mg | ORAL_TABLET | Freq: Every day | ORAL | 0 refills | Status: DC

## 2022-12-11 NOTE — Telephone Encounter (Signed)
Med refill request:estradiol Last AEX: 07/09/22 Next AEX: not yet scheduled Last MMG (if hormonal med) 06/12/22 Bi-rads cat 1 neg Refill authorized: Please Advise?

## 2023-01-07 ENCOUNTER — Other Ambulatory Visit (HOSPITAL_COMMUNITY): Payer: Self-pay

## 2023-01-07 MED ORDER — EMTRICITABINE-TENOFOVIR DF 200-300 MG PO TABS
1.0000 | ORAL_TABLET | Freq: Every day | ORAL | 0 refills | Status: DC
  Filled 2023-01-07: qty 7, 7d supply, fill #0
  Filled 2023-01-09 (×2): qty 23, 23d supply, fill #1

## 2023-01-07 MED ORDER — DOLUTEGRAVIR SODIUM 50 MG PO TABS
50.0000 mg | ORAL_TABLET | Freq: Every day | ORAL | 0 refills | Status: DC
  Filled 2023-01-07: qty 7, 7d supply, fill #0
  Filled 2023-01-09: qty 23, 23d supply, fill #1

## 2023-01-09 ENCOUNTER — Other Ambulatory Visit (HOSPITAL_COMMUNITY): Payer: Self-pay

## 2023-01-14 ENCOUNTER — Other Ambulatory Visit (HOSPITAL_COMMUNITY): Payer: Self-pay

## 2023-01-15 ENCOUNTER — Other Ambulatory Visit: Payer: Self-pay | Admitting: Family Medicine

## 2023-01-15 DIAGNOSIS — F988 Other specified behavioral and emotional disorders with onset usually occurring in childhood and adolescence: Secondary | ICD-10-CM

## 2023-01-16 MED ORDER — AMPHETAMINE-DEXTROAMPHET ER 20 MG PO CP24: 20.0000 mg | ORAL_CAPSULE | ORAL | 0 refills | Status: DC

## 2023-01-21 ENCOUNTER — Other Ambulatory Visit: Payer: Self-pay

## 2023-01-21 NOTE — Telephone Encounter (Signed)
Last AEX 10/17/2021--recall sent. Nothing currently scheduled.   #24 with one refill sent on 12/11/2022, pt should not need refill at this time. Will contact pharmacy to confirm.   Pharmacy reported that they have refills on file. Will notify pt to make sure she is aware.

## 2023-01-21 NOTE — Telephone Encounter (Signed)
From: Ebony Cargo To: Office of [place], [place] B, NP Sent: 01/15/2023 6:23 PM EDT Subject: Medication Renewal Request  Refills have been requested for the following medications:   estradiol (VIVELLE-DOT) 0.1 MG/24HR patch [CHRZANOWSKI, JAMI B]  [id number] - [place], [place] - [address] AT [address] OF [facility] [address] Delivery method: Pickup   Medication renewals requested in this message routed separately:   amphetamine-dextroamphetamine (ADDERALL XR) 20 MG 24 hr capsule Britta Mccreedy M Michael]

## 2023-01-28 ENCOUNTER — Encounter: Payer: Managed Care, Other (non HMO) | Admitting: Family Medicine

## 2023-01-29 ENCOUNTER — Ambulatory Visit (INDEPENDENT_AMBULATORY_CARE_PROVIDER_SITE_OTHER): Payer: Managed Care, Other (non HMO) | Admitting: Family Medicine

## 2023-01-29 ENCOUNTER — Encounter: Payer: Self-pay | Admitting: Family Medicine

## 2023-01-29 VITALS — BP 102/64 | HR 78 | Temp 98.4°F | Wt 155.6 lb

## 2023-01-29 DIAGNOSIS — R1011 Right upper quadrant pain: Secondary | ICD-10-CM | POA: Diagnosis not present
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
## 2023-02-05 ENCOUNTER — Ambulatory Visit
Admission: RE | Admit: 2023-02-05 | Discharge: 2023-02-05 | Disposition: A | Discharge status: Home / Self Care | Payer: Managed Care, Other (non HMO) | Source: Ambulatory Visit | Attending: Family Medicine | Admitting: Family Medicine

## 2023-02-05 DIAGNOSIS — R1011 Right upper quadrant pain: Secondary | ICD-10-CM

## 2023-02-18 ENCOUNTER — Other Ambulatory Visit: Payer: Self-pay

## 2023-02-18 ENCOUNTER — Encounter: Payer: Self-pay | Admitting: Internal Medicine

## 2023-02-18 ENCOUNTER — Ambulatory Visit (INDEPENDENT_AMBULATORY_CARE_PROVIDER_SITE_OTHER): Payer: No Typology Code available for payment source | Admitting: Internal Medicine

## 2023-02-18 VITALS — BP 122/75 | HR 71 | Ht 65.5 in | Wt 162.0 lb

## 2023-02-18 DIAGNOSIS — W461XXA Contact with contaminated hypodermic needle, initial encounter: Secondary | ICD-10-CM

## 2023-02-18 DIAGNOSIS — Z206 Contact with and (suspected) exposure to human immunodeficiency virus [HIV]: Secondary | ICD-10-CM | POA: Diagnosis not present

## 2023-02-18 DIAGNOSIS — W461XXD Contact with contaminated hypodermic needle, subsequent encounter: Secondary | ICD-10-CM | POA: Diagnosis not present
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
## 2023-02-19 ENCOUNTER — Ambulatory Visit: Payer: Managed Care, Other (non HMO) | Admitting: Family Medicine

## 2023-02-19 ENCOUNTER — Encounter: Payer: Self-pay | Admitting: Family Medicine

## 2023-02-19 VITALS — BP 116/70 | HR 70 | Temp 98.5°F | Ht 65.5 in | Wt 154.9 lb

## 2023-02-19 DIAGNOSIS — F32A Depression, unspecified: Secondary | ICD-10-CM

## 2023-02-19 DIAGNOSIS — Z13220 Encounter for screening for lipoid disorders: Secondary | ICD-10-CM

## 2023-02-19 DIAGNOSIS — N951 Menopausal and female climacteric states: Secondary | ICD-10-CM | POA: Diagnosis not present

## 2023-02-19 DIAGNOSIS — F988 Other specified behavioral and emotional disorders with onset usually occurring in childhood and adolescence: Secondary | ICD-10-CM | POA: Diagnosis not present

## 2023-02-19 DIAGNOSIS — R5382 Chronic fatigue, unspecified: Secondary | ICD-10-CM

## 2023-02-19 DIAGNOSIS — Z0000 Encounter for general adult medical examination without abnormal findings: Secondary | ICD-10-CM

## 2023-02-19 LAB — LIPID PANEL
Cholesterol: 176 mg/dL (ref 0–200)
HDL: 87.1 mg/dL (ref >39.00)
LDL Cholesterol: 81 mg/dL (ref 0–99)
NonHDL: 88.79
Total CHOL/HDL Ratio: 2
Triglycerides: 41 mg/dL (ref 0.0–149.0)
VLDL: 8.2 mg/dL (ref 0.0–40.0)

## 2023-02-19 LAB — COMPREHENSIVE METABOLIC PANEL
ALT: 18 U/L (ref 0–35)
AST: 18 U/L (ref 0–37)
Albumin: 4.7 g/dL (ref 3.5–5.2)
Alkaline Phosphatase: 47 U/L (ref 39–117)
BUN: 11 mg/dL (ref 6–23)
CO2: 29 mEq/L (ref 19–32)
Calcium: 10 mg/dL (ref 8.4–10.5)
Chloride: 103 mEq/L (ref 96–112)
Creatinine, Ser: 0.79 mg/dL (ref 0.40–1.20)
GFR: 85.73 mL/min (ref >60.00)
Glucose, Bld: 90 mg/dL (ref 70–99)
Potassium: 4.2 mEq/L (ref 3.5–5.1)
Sodium: 138 mEq/L (ref 135–145)
Total Bilirubin: 0.9 mg/dL (ref 0.2–1.2)
Total Protein: 7.2 g/dL (ref 6.0–8.3)

## 2023-02-19 LAB — TSH: TSH: 1.46 u[IU]/mL (ref 0.35–5.50)

## 2023-02-19 MED ORDER — ESTRADIOL 0.75 MG/0.75GM TD GEL: 1.0000 | TRANSDERMAL | 1 refills | Status: DC

## 2023-02-19 MED ORDER — PROGESTERONE MICRONIZED 100 MG PO CAPS
100.0000 mg | ORAL_CAPSULE | Freq: Every day | ORAL | 1 refills | Status: DC
Start: 2023-02-19 — End: 2023-06-30

## 2023-02-19 MED ORDER — ESCITALOPRAM OXALATE 10 MG PO TABS: 10.0000 mg | ORAL_TABLET | Freq: Every day | ORAL | 1 refills | Status: DC

## 2023-02-19 MED ORDER — AMPHETAMINE-DEXTROAMPHET ER 20 MG PO CP24
20.0000 mg | ORAL_CAPSULE | ORAL | 0 refills | Status: DC
Start: 2023-02-19 — End: 2023-03-28

## 2023-02-19 NOTE — Patient Instructions (Signed)

## 2023-02-19 NOTE — Progress Notes (Signed)
Complete physical exam  Patient: Yolanda Santana   DOB: 11-May-1971   52 y.o. Female  MRN: [id number]  Subjective:    Chief Complaint  Patient presents with   Annual Exam    Patient requests to decrease doses of Estradiol and Progesterone    Yolanda Santana is a 52 y.o. female who presents today for a complete physical exam. She reports consuming a general diet. Home exercise routine includes some walking, however she does not do this very often. She generally feels fairly well. She reports sleeping has difficulty falling asleep, wakes up multiple times at night. She does not have additional problems to discuss today.    Most recent fall risk assessment:    02/18/2023    3:09 PM  Fall Risk   Falls in the past year? 0  Risk for fall due to : No Fall Risks  Follow up Falls evaluation completed     Most recent depression screenings:    02/19/2023   11:05 AM 02/18/2023    3:09 PM  PHQ 2/9 Scores  PHQ - 2 Score 4 1  PHQ- 9 Score 14     Vision:Within last year and Dental: No current dental problems and Receives regular dental care  Patient Active Problem List   Diagnosis Date Noted   Acute thoracic back pain 04/25/2022   Depressive disorder 04/10/2021   Abdominal pain, generalized 10/18/2020   Irritable bowel syndrome with diarrhea 10/18/2020   Allergic rhinitis 08/23/2020   Ear itching 05/05/2020   Neck pain on left side 05/05/2020   Sensorineural hearing loss (SNHL) of left ear with unrestricted hearing of right ear 05/05/2020   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   NON FORMULARY Number of herbs and supplements   valACYclovir (VALTREX) 1000 MG tablet TAKE 2 TABLETS TWICE A DAY X 1 DAY AT ONSET OF SYMPTOMS   [DISCONTINUED] amphetamine-dextroamphetamine (ADDERALL XR) 20 MG 24 hr capsule Take 1 capsule (20 mg total) by mouth every morning.   [DISCONTINUED] escitalopram (LEXAPRO) 10 MG tablet Take 1 tablet (10 mg total) by mouth daily.   [DISCONTINUED] estradiol (VIVELLE-DOT) 0.1 MG/24HR patch Place 1 patch (0.1 mg total) onto the skin 2 (two) times a week.   [DISCONTINUED] Estradiol 0.75 MG/0.75GM GEL Place onto the skin 2 (two) times a week.   [DISCONTINUED] progesterone (PROMETRIUM) 200 MG capsule Take 1 capsule (200 mg total) by mouth daily. (Patient taking differently: Take 200 mg by mouth every other day.)   No facility-administered medications prior to visit.    Review of Systems  HENT:  Negative for hearing loss.   Eyes:  Negative for blurred vision.  Respiratory:  Negative for shortness of breath.   Cardiovascular:  Negative for chest pain.  Gastrointestinal: Negative.   Genitourinary: Negative.   Musculoskeletal:  Negative for back pain.  Neurological:  Negative for headaches.  Psychiatric/Behavioral:  Negative for depression.   All other systems reviewed and are negative.  Objective:     BP 116/70 (BP Location: Left Arm, Patient Position: Sitting, Cuff Size: Normal)   Pulse 70   Temp 98.5 F (36.9 C) (Oral)   Ht 5' 5.5" (1.664 m)   Wt 154 lb 14.4 oz (70.3 kg)   SpO2 100%   BMI 25.38 kg/m    Physical Exam Vitals reviewed.  Constitutional:      Appearance: Normal appearance. She is well-groomed and normal weight.  HENT:     Right Ear: Tympanic membrane normal.     Left Ear: Tympanic membrane normal.     Mouth/Throat:     Mouth: Mucous membranes are moist.     Pharynx: No  posterior oropharyngeal erythema.  Eyes:     Conjunctiva/sclera: Conjunctivae normal.  Neck:     Thyroid: No thyromegaly.  Cardiovascular:     Rate and Rhythm: Normal rate and regular rhythm.     Pulses: Normal pulses.     Heart sounds: S1 normal and S2 normal.  Pulmonary:     Effort: Pulmonary effort is normal.     Breath sounds: Normal breath sounds and air entry.  Abdominal:     General: Bowel sounds are normal.  Musculoskeletal:     Right lower leg: No edema.     Left lower leg: No edema.  Lymphadenopathy:     Cervical: No cervical adenopathy.  Neurological:     Mental Status: She is alert and oriented to person, place, and time. Mental status is at baseline.     Gait: Gait is intact.  Psychiatric:        Mood and Affect: Mood and affect normal.        Speech: Speech normal.        Behavior: Behavior normal.        Judgment: Judgment normal.      No results found for any visits on 02/19/23.     Assessment & Plan:    Routine Health Maintenance and Physical Exam  Immunization History  Administered Date(s) Administered   Hepatitis B 06/18/2013, 11/04/2013, 10/27/2014   Influenza-Unspecified 05/28/2014, 06/09/2020   PFIZER(Purple Top)SARS-COV-2 Vaccination 12/25/2019, 01/17/2020   Td 05/03/1992   Tdap 10/30/2010, 08/26/2013   Zoster Recombinat (Shingrix) 12/10/2021    Health Maintenance  Topic Date Due   Zoster Vaccines- Shingrix (2 of 2) 02/04/2022   COVID-19 Vaccine (3 - Pfizer risk series) 03/07/2023 (Originally 02/14/2020)   Hepatitis C Screening  08/14/2023 (Originally 04/05/1988)   HIV Screening  08/14/2023 (Originally 04/05/1985)   PAP SMEAR-Modifier  03/26/2024 (Originally 03/30/2022)   INFLUENZA VACCINE  03/27/2023   DTaP/Tdap/Td (4 - Td or Tdap) 08/27/2023   MAMMOGRAM  06/12/2024   Colonoscopy  07/05/2030   HPV VACCINES  Aged Out    Discussed health benefits of physical activity, and encouraged her to engage in regular exercise appropriate for her age  and condition.  Menopausal flushing -     Progesterone; Take 1 capsule (100 mg total) by mouth daily.  Dispense: 90 capsule; Refill: 1 -     Estradiol; Place 1 patch onto the skin 2 (two) times a week.  Dispense: 30 each; Refill: 1  Depressive disorder -     Escitalopram Oxalate; Take 1 tablet (10 mg total) by mouth daily.  Dispense: 90 tablet; Refill: 1 -     Comprehensive metabolic panel; Future -     TSH; Future  Attention deficit disorder, unspecified hyperactivity presence -     Amphetamine-Dextroamphet ER; Take 1 capsule (20 mg  total) by mouth every morning.  Dispense: 30 capsule; Refill: 0  Lipid screening -     Lipid panel; Future  Chronic fatigue -     Lyme Disease Serology w/Reflex; Future  Routine general medical examination at a health care facility  Normal physical exam findings today, she is reporting chronic fatigue, will check annual labs for surveillance. Pt was counseled on healthy sleep habits and handouts of healthy eating and exercise were given to the patient. Health maintenance was reviewed.   Return in about 3 months (around 05/22/2023) for video visit for medication refills.     Karie Georges, MD

## 2023-02-20 LAB — LYME DISEASE SEROLOGY W/REFLEX: Lyme Total Antibody EIA: NEGATIVE

## 2023-03-04 ENCOUNTER — Ambulatory Visit (HOSPITAL_COMMUNITY): Payer: Self-pay | Admitting: Mental Health

## 2023-03-05 ENCOUNTER — Other Ambulatory Visit: Payer: Self-pay | Admitting: Family Medicine

## 2023-03-05 DIAGNOSIS — N951 Menopausal and female climacteric states: Secondary | ICD-10-CM

## 2023-03-05 MED ORDER — ESTRADIOL 0.75 MG/0.75GM TD GEL: 0.7500 mg | TRANSDERMAL | 1 refills | Status: DC

## 2023-03-10 ENCOUNTER — Other Ambulatory Visit: Payer: Self-pay | Admitting: Radiology

## 2023-03-11 ENCOUNTER — Telehealth: Payer: Self-pay | Admitting: Family Medicine

## 2023-03-11 ENCOUNTER — Telehealth: Payer: Self-pay

## 2023-03-11 DIAGNOSIS — N951 Menopausal and female climacteric states: Secondary | ICD-10-CM

## 2023-03-11 MED ORDER — ESTRADIOL 0.75 MG/0.75GM TD GEL: 0.7500 mg | TRANSDERMAL | 1 refills | Status: DC

## 2023-03-11 NOTE — Telephone Encounter (Signed)
Left a detailed message with the information below at the patient's cell number. ?

## 2023-03-11 NOTE — Telephone Encounter (Signed)
Spouse Yolanda Santana (on pt's DPR) LVM in triage line stating that wrong rx was sent in for pt and is requesting a call back.   When I called back, both spouse and pt were present on the phone and they both informed me that issue was resolved by PCP and they will contact us if anything further was needed.   Will route to provider for final review and close.

## 2023-03-11 NOTE — Telephone Encounter (Signed)
Pt is calling and the wrong medication was sent to pharm Estradiol 0.75 MG/0.75GM Patches not gel  [id number] [place], [place] - [address] AT [facility] [address] [facility] [address] Phone: [phone]  Fax: [phone]

## 2023-03-11 NOTE — Telephone Encounter (Signed)
Ok I fixed it sorry!

## 2023-03-13 ENCOUNTER — Encounter: Payer: Self-pay | Admitting: Family Medicine

## 2023-03-13 NOTE — Telephone Encounter (Signed)
Patient calling back checking on progress of this request. Says the pharmacy still does not have the correct prescription.

## 2023-03-13 NOTE — Telephone Encounter (Signed)
Then I'm not sure what is going on---I fixed the directions on the gel so I'm not sure what the problem is.Marland KitchenMarland Kitchen

## 2023-03-13 NOTE — Telephone Encounter (Signed)
[facility] and she stated the Rx received was for the gel, not patches.  Message sent to PCP.

## 2023-03-13 NOTE — Telephone Encounter (Signed)
Spoke with the patient and she stated she would like an Rx for Estradiol 0.075 patches-change twice a week-1 box which contains 8.  Message sent to PCP.

## 2023-03-13 NOTE — Telephone Encounter (Signed)
error 

## 2023-03-14 ENCOUNTER — Other Ambulatory Visit: Payer: Self-pay | Admitting: Family Medicine

## 2023-03-14 DIAGNOSIS — Z79890 Hormone replacement therapy (postmenopausal): Secondary | ICD-10-CM

## 2023-03-14 MED ORDER — ESTRADIOL 0.1 MG/24HR TD PTTW
1.0000 | MEDICATED_PATCH | TRANSDERMAL | 1 refills | Status: DC
Start: 2023-03-17 — End: 2023-09-15

## 2023-03-14 NOTE — Telephone Encounter (Signed)
Patient informed of the message below.

## 2023-03-14 NOTE — Telephone Encounter (Signed)
Script sent-- sorry it was in the computer as the gel so I didn't know she wanted the patches.Marland KitchenMarland KitchenMarland Kitchen

## 2023-03-28 ENCOUNTER — Other Ambulatory Visit: Payer: Self-pay | Admitting: Family Medicine

## 2023-03-28 DIAGNOSIS — F988 Other specified behavioral and emotional disorders with onset usually occurring in childhood and adolescence: Secondary | ICD-10-CM

## 2023-03-31 MED ORDER — AMPHETAMINE-DEXTROAMPHET ER 20 MG PO CP24
20.0000 mg | ORAL_CAPSULE | ORAL | 0 refills | Status: DC
Start: 2023-03-31 — End: 2023-05-02

## 2023-04-08 ENCOUNTER — Encounter: Payer: Self-pay | Admitting: Family Medicine

## 2023-04-08 DIAGNOSIS — R1011 Right upper quadrant pain: Secondary | ICD-10-CM

## 2023-04-23 ENCOUNTER — Encounter (HOSPITAL_COMMUNITY)
Admission: RE | Admit: 2023-04-23 | Discharge: 2023-04-23 | Disposition: A | Discharge status: Home / Self Care | Payer: Managed Care, Other (non HMO) | Source: Ambulatory Visit | Attending: Family Medicine | Admitting: Family Medicine

## 2023-04-23 DIAGNOSIS — R1011 Right upper quadrant pain: Secondary | ICD-10-CM | POA: Diagnosis present

## 2023-04-23 MED ORDER — TECHNETIUM TC 99M MEBROFENIN IV KIT
5.3000 | PACK | Freq: Once | INTRAVENOUS | Status: AC
  Administered 2023-04-23: 5.3 via INTRAVENOUS

## 2023-05-02 ENCOUNTER — Other Ambulatory Visit: Payer: Self-pay | Admitting: Family Medicine

## 2023-05-02 DIAGNOSIS — F988 Other specified behavioral and emotional disorders with onset usually occurring in childhood and adolescence: Secondary | ICD-10-CM

## 2023-05-05 MED ORDER — AMPHETAMINE-DEXTROAMPHET ER 20 MG PO CP24
20.0000 mg | ORAL_CAPSULE | ORAL | 0 refills | Status: DC
Start: 2023-05-05 — End: 2023-06-08

## 2023-05-23 ENCOUNTER — Other Ambulatory Visit: Payer: Self-pay | Admitting: Family Medicine

## 2023-05-23 DIAGNOSIS — Z1231 Encounter for screening mammogram for malignant neoplasm of breast: Secondary | ICD-10-CM

## 2023-06-04 ENCOUNTER — Telehealth: Payer: Self-pay | Admitting: Family Medicine

## 2023-06-04 ENCOUNTER — Other Ambulatory Visit: Payer: Self-pay | Admitting: Family Medicine

## 2023-06-04 ENCOUNTER — Ambulatory Visit: Payer: Managed Care, Other (non HMO) | Admitting: Family Medicine

## 2023-06-04 VITALS — BP 118/74 | HR 85 | Temp 99.3°F | Resp 12 | Ht 65.5 in | Wt 151.8 lb

## 2023-06-04 DIAGNOSIS — J069 Acute upper respiratory infection, unspecified: Secondary | ICD-10-CM

## 2023-06-04 DIAGNOSIS — N63 Unspecified lump in unspecified breast: Secondary | ICD-10-CM | POA: Diagnosis not present

## 2023-06-04 LAB — POCT INFLUENZA A/B
Influenza A, POC: NEGATIVE
Influenza B, POC: NEGATIVE

## 2023-06-04 LAB — POC COVID19 BINAXNOW: SARS Coronavirus 2 Ag: NEGATIVE

## 2023-06-04 NOTE — Patient Instructions (Addendum)
A few things to remember from today's visit:  Breast mass in female - Plan: MM 3D DIAGNOSTIC MAMMOGRAM BILATERAL BREAST  URI, acute - Plan: POCT Influenza A/B, POC COVID-19  He seems to be viral. Monitor for new symptoms. Symptomatic treatment with DayQuil and NyQuil as well as throat lozenges recommended. If symptoms are persisting, you can repeat COVID-19 test tomorrow or the day after tomorrow.  Do not use My Chart to request refills or for acute issues that need immediate attention. If you send a my chart message, it may take a few days to be addressed, specially if I am not in the office.  Please be sure medication list is accurate. If a new problem present, please set up appointment sooner than planned today.  [facility]  - call [phone]

## 2023-06-04 NOTE — Telephone Encounter (Signed)
Pt called to inform MD that she found a lump and is worried sick.  Pt is requesting a Mammogram Referral, preferably to [facility].  Pt is asking if MD could please expedite this request, as she is on the brink of losing her mind with worry.
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
## 2023-06-04 NOTE — Telephone Encounter (Signed)
Spoke with the patient and informed her a visit is needed for evaluation first.  Appt was scheduled today with Dr [place] as PCP does not have openings.

## 2023-06-08 ENCOUNTER — Other Ambulatory Visit: Payer: Self-pay | Admitting: Family Medicine

## 2023-06-08 DIAGNOSIS — F908 Attention-deficit hyperactivity disorder, other type: Secondary | ICD-10-CM

## 2023-06-08 DIAGNOSIS — F988 Other specified behavioral and emotional disorders with onset usually occurring in childhood and adolescence: Secondary | ICD-10-CM

## 2023-06-09 MED ORDER — AMPHETAMINE-DEXTROAMPHET ER 20 MG PO CP24
20.0000 mg | ORAL_CAPSULE | ORAL | 0 refills | Status: DC
Start: 2023-06-09 — End: 2023-07-17

## 2023-06-10 ENCOUNTER — Other Ambulatory Visit: Payer: Self-pay | Admitting: Family Medicine

## 2023-06-10 DIAGNOSIS — N951 Menopausal and female climacteric states: Secondary | ICD-10-CM

## 2023-06-12 ENCOUNTER — Encounter: Payer: Self-pay | Admitting: Family Medicine

## 2023-06-12 LAB — HM MAMMOGRAPHY

## 2023-06-17 ENCOUNTER — Other Ambulatory Visit: Payer: Managed Care, Other (non HMO)

## 2023-06-19 ENCOUNTER — Ambulatory Visit: Payer: Managed Care, Other (non HMO)

## 2023-06-24 ENCOUNTER — Ambulatory Visit: Payer: Managed Care, Other (non HMO)

## 2023-06-30 MED ORDER — PROGESTERONE MICRONIZED 100 MG PO CAPS: 100.0000 mg | ORAL_CAPSULE | Freq: Every day | ORAL | 1 refills | Status: DC

## 2023-06-30 NOTE — Addendum Note (Signed)
Addended by: Sallee Lange A on: 06/30/2023 08:30 AM   Modules accepted: Orders

## 2023-06-30 NOTE — Telephone Encounter (Signed)
Rx re-sent due to transmission problem.

## 2023-07-17 ENCOUNTER — Other Ambulatory Visit: Payer: Self-pay | Admitting: Family Medicine

## 2023-07-17 DIAGNOSIS — F908 Attention-deficit hyperactivity disorder, other type: Secondary | ICD-10-CM

## 2023-07-18 MED ORDER — AMPHETAMINE-DEXTROAMPHET ER 20 MG PO CP24
20.0000 mg | ORAL_CAPSULE | ORAL | 0 refills | Status: DC
Start: 2023-07-18 — End: 2023-08-28

## 2023-07-22 ENCOUNTER — Other Ambulatory Visit: Payer: Self-pay | Admitting: Acute Care

## 2023-07-22 DIAGNOSIS — Z87891 Personal history of nicotine dependence: Secondary | ICD-10-CM

## 2023-07-22 DIAGNOSIS — Z122 Encounter for screening for malignant neoplasm of respiratory organs: Secondary | ICD-10-CM

## 2023-07-22 DIAGNOSIS — F17210 Nicotine dependence, cigarettes, uncomplicated: Secondary | ICD-10-CM

## 2023-08-28 ENCOUNTER — Ambulatory Visit
Admission: RE | Admit: 2023-08-28 | Discharge: 2023-08-28 | Disposition: A | Discharge status: Home / Self Care | Payer: Managed Care, Other (non HMO) | Source: Ambulatory Visit | Attending: Acute Care | Admitting: Acute Care

## 2023-08-28 ENCOUNTER — Other Ambulatory Visit: Payer: Self-pay | Admitting: Family Medicine

## 2023-08-28 DIAGNOSIS — F17210 Nicotine dependence, cigarettes, uncomplicated: Secondary | ICD-10-CM

## 2023-08-28 DIAGNOSIS — Z87891 Personal history of nicotine dependence: Secondary | ICD-10-CM

## 2023-08-28 DIAGNOSIS — F908 Attention-deficit hyperactivity disorder, other type: Secondary | ICD-10-CM

## 2023-08-28 DIAGNOSIS — Z122 Encounter for screening for malignant neoplasm of respiratory organs: Secondary | ICD-10-CM

## 2023-08-28 MED ORDER — AMPHETAMINE-DEXTROAMPHET ER 20 MG PO CP24
20.0000 mg | ORAL_CAPSULE | ORAL | 0 refills | Status: DC
Start: 2023-08-28 — End: 2023-10-05

## 2023-08-28 NOTE — Telephone Encounter (Signed)
 Pt LOV was on 06/04/23 Last refill was done on 07/18/23 Please advise

## 2023-09-09 ENCOUNTER — Other Ambulatory Visit: Payer: Self-pay

## 2023-09-09 DIAGNOSIS — Z87891 Personal history of nicotine dependence: Secondary | ICD-10-CM

## 2023-09-09 DIAGNOSIS — Z122 Encounter for screening for malignant neoplasm of respiratory organs: Secondary | ICD-10-CM

## 2023-09-15 ENCOUNTER — Other Ambulatory Visit: Payer: Self-pay | Admitting: Family Medicine

## 2023-09-15 ENCOUNTER — Other Ambulatory Visit: Payer: Self-pay | Admitting: Radiology

## 2023-09-15 DIAGNOSIS — Z79890 Hormone replacement therapy (postmenopausal): Secondary | ICD-10-CM

## 2023-09-15 NOTE — Telephone Encounter (Signed)
Copied from CRM [id number]. Topic: Clinical - Medication Refill >> Sep 15, 2023  8:47 AM Drema Balzarine wrote: Most Recent Primary Care Visit:  Provider: [place], BETTY G  Department: [facility]  Visit Type: OFFICE VISIT  Date: 06/04/2023  Medication: estradiol (VIVELLE-DOT) 0.1 MG/24HR patch   Has the patient contacted their pharmacy? Yes (Agent: If no, request that the patient contact the pharmacy for the refill. If patient does not wish to contact the pharmacy document the reason why and proceed with request.) (Agent: If yes, when and what did the pharmacy advise?)  Is this the correct pharmacy for this prescription? Yes If no, delete pharmacy and type the correct one.  This is the patient's preferred pharmacy:   [id number] - [place], [place] - [address] AT [address] OF [facility] [address] [place] [address] Phone: [phone] Fax: [phone]   Has the prescription been filled recently? Yes  Is the patient out of the medication? Yes  Has the patient been seen for an appointment in the last year OR does the patient have an upcoming appointment? Yes  Can we respond through MyChart? Yes  Agent: Please be advised that Rx refills may take up to 3 business days. We ask that you follow-up with your pharmacy.

## 2023-09-16 MED ORDER — ESTRADIOL 0.1 MG/24HR TD PTTW: 1.0000 | MEDICATED_PATCH | TRANSDERMAL | 1 refills | Status: DC

## 2023-09-16 NOTE — Telephone Encounter (Signed)
Medication refill request: vivelle dot patch 0.1mg  Last AEX:  10-17-21 Next AEX: not scheduled. Message sent to scheduling department to call patient to schedule Last MMG (if hormonal medication request): 06-12-23 bilateral & rt breast u/s birads 1:neg Refill authorized: please approve or deny as appropriate

## 2023-10-01 ENCOUNTER — Ambulatory Visit: Payer: Managed Care, Other (non HMO) | Admitting: Family Medicine

## 2023-10-04 ENCOUNTER — Other Ambulatory Visit: Payer: Self-pay | Admitting: Family Medicine

## 2023-10-04 DIAGNOSIS — F32A Depression, unspecified: Secondary | ICD-10-CM

## 2023-10-05 ENCOUNTER — Other Ambulatory Visit: Payer: Self-pay | Admitting: Family Medicine

## 2023-10-05 DIAGNOSIS — B001 Herpesviral vesicular dermatitis: Secondary | ICD-10-CM

## 2023-10-05 DIAGNOSIS — F908 Attention-deficit hyperactivity disorder, other type: Secondary | ICD-10-CM

## 2023-10-07 MED ORDER — AMPHETAMINE-DEXTROAMPHET ER 20 MG PO CP24: 20.0000 mg | ORAL_CAPSULE | ORAL | 0 refills | Status: DC

## 2023-10-09 ENCOUNTER — Ambulatory Visit: Payer: Self-pay | Admitting: Family Medicine

## 2023-10-09 NOTE — Telephone Encounter (Signed)
Ok to close

## 2023-10-09 NOTE — Telephone Encounter (Signed)
Copied from CRM [id number]. Topic: Clinical - Red Word Triage >> Oct 09, 2023 10:51 AM Marica Otter wrote: [facility] that prompted transfer to Nurse Triage: Back and chest pain.   Chief Complaint: Chest pain x 1 month Symptoms: right side chest pain radiating to the back Frequency: intermittent Pertinent Negatives: Patient denies shortness of breath Disposition: [] ED /[] Urgent Care (no appt availability in office) / [x] Appointment(In office/virtual)/ []  [facility]/ [] Home Care/ [] Refused Recommended Disposition /[]  Mobile Bus/ []  Follow-up with PCP Additional Notes: Pt reports chest pain x 1 month. Increasing in intensity. Pt sts that her PCP ordered a CT scan last week, but she hadnt been contacted about the results. Pt sts that the pain is intermittent, but she cannot identify aggravtaing factors. Pain is not present at this time but comes and goes through the day. Appt scheduled for tomorrow with Dr. Caryl Never  Reason for Disposition  [1] Chest pain lasts > 5 minutes AND [2] occurred > 3 days ago (72 hours) AND [3] NO chest pain or cardiac symptoms now  Answer Assessment - Initial Assessment Questions 1. LOCATION: "Where does it hurt?"       Right side  2. RADIATION: "Does the pain go anywhere else?" (e.g., into neck, jaw, arms, back)     Radiates to backs  3. ONSET: "When did the chest pain begin?" (Minutes, hours or days)      Started 1 month ago  4. PATTERN: "Does the pain come and go, or has it been constant since it started?"  "Does it get worse with exertion?"      Comes and goes, unable to determine what aggravates the month  5. DURATION: "How long does it last" (e.g., seconds, minutes, hours)     Last for a couple days  6. SEVERITY: "How bad is the pain?"  (e.g., Scale 1-10; mild, moderate, or severe)    - MILD (1-3): doesn't interfere with normal activities     - MODERATE (4-7): interferes with normal activities or awakens from sleep    - SEVERE  (8-10): excruciating pain, unable to do any normal activities       Mild pain at this time, but can get severe at time  7. CARDIAC RISK FACTORS: "Do you have any history of heart problems or risk factors for heart disease?" (e.g., angina, prior heart attack; diabetes, high blood pressure, high cholesterol, smoker, or strong family history of heart disease)     Nothing personally, but mother has irregular heart beat  8. PULMONARY RISK FACTORS: "Do you have any history of lung disease?"  (e.g., blood clots in lung, asthma, emphysema, birth control pills)     Asthma  9. CAUSE: "What do you think is causing the chest pain?"     Unsure of possible causes, maybe stress related  10. OTHER SYMPTOMS: "Do you have any other symptoms?" (e.g., dizziness, nausea, vomiting, sweating, fever, difficulty breathing, cough)       Intermittent nausea and vomiting, but pt has hx of IBS  11. PREGNANCY: "Is there any chance you are pregnant?" "When was your last menstrual period?"       No, pt is menopausal  Protocols used: Chest Pain-A-AH

## 2023-10-10 ENCOUNTER — Encounter: Payer: Self-pay | Admitting: Family Medicine

## 2023-10-10 ENCOUNTER — Ambulatory Visit: Payer: Managed Care, Other (non HMO) | Admitting: Family Medicine

## 2023-10-10 VITALS — BP 108/62 | HR 83 | Temp 98.1°F | Wt 164.4 lb

## 2023-10-10 DIAGNOSIS — M546 Pain in thoracic spine: Secondary | ICD-10-CM

## 2023-10-10 DIAGNOSIS — R5383 Other fatigue: Secondary | ICD-10-CM | POA: Diagnosis not present

## 2023-10-10 DIAGNOSIS — R0609 Other forms of dyspnea: Secondary | ICD-10-CM

## 2023-10-10 DIAGNOSIS — R0789 Other chest pain: Secondary | ICD-10-CM
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
## 2023-10-16 ENCOUNTER — Ambulatory Visit (HOSPITAL_COMMUNITY)
Admission: RE | Admit: 2023-10-16 | Discharge: 2023-10-16 | Disposition: A | Discharge status: Home / Self Care | Payer: Self-pay | Source: Ambulatory Visit | Attending: Family Medicine | Admitting: Family Medicine

## 2023-10-16 DIAGNOSIS — R0789 Other chest pain: Secondary | ICD-10-CM | POA: Insufficient documentation

## 2023-10-23 ENCOUNTER — Ambulatory Visit: Payer: Managed Care, Other (non HMO) | Admitting: Family Medicine

## 2023-10-29 ENCOUNTER — Ambulatory Visit: Payer: Managed Care, Other (non HMO) | Admitting: Radiology

## 2023-11-06 ENCOUNTER — Telehealth: Payer: Self-pay | Admitting: Gastroenterology

## 2023-11-06 NOTE — Telephone Encounter (Signed)
 Good afternoon Dr. Myrtie Neither,    Patient is requesting to be seen for RUQ abdominal pain. Patient is wishing to transfer her care to Dr. Doy Hutching due to being unhappy with care provided. Please review and advise on request.    Thank you.

## 2023-11-09 NOTE — Telephone Encounter (Signed)
 It is fine with me, but Dr. Doy Hutching would have to be agreeable.  H Danis

## 2023-11-10 NOTE — Telephone Encounter (Signed)
 Good morning Dr. Doy Hutching,    Patient is requesting to transfer her care over to you. Please review and advise on request.    Thank you.

## 2023-11-11 ENCOUNTER — Other Ambulatory Visit: Payer: Self-pay | Admitting: Family Medicine

## 2023-11-11 DIAGNOSIS — F908 Attention-deficit hyperactivity disorder, other type: Secondary | ICD-10-CM

## 2023-11-19 ENCOUNTER — Encounter: Payer: Self-pay | Admitting: Family Medicine

## 2023-11-19 ENCOUNTER — Ambulatory Visit (INDEPENDENT_AMBULATORY_CARE_PROVIDER_SITE_OTHER): Admitting: Family Medicine

## 2023-11-19 DIAGNOSIS — F908 Attention-deficit hyperactivity disorder, other type: Secondary | ICD-10-CM

## 2023-11-19 MED ORDER — AMPHETAMINE-DEXTROAMPHET ER 20 MG PO CP24: 20.0000 mg | ORAL_CAPSULE | ORAL | 0 refills | Status: DC

## 2023-11-19 NOTE — Assessment & Plan Note (Signed)
 Will refill her adderall for the next 3 months. I reminded the patient about the DEA guidelines and that she will need to be seen at least every 4-6 months for her refills. Pt voiced understanding.

## 2023-11-19 NOTE — Progress Notes (Signed)
 Established Patient Office Visit  Subjective   Patient ID: Yolanda Santana, female    DOB: 01-02-1971  Age: 53 y.o. MRN: [id number]  Chief Complaint  Patient presents with   Medical Management of Chronic Issues    Pt is here for follow up today for medication refills. She reports that she is feeling well, states that the adderall works well for her, no side effects reported.   Pt states she is going to [facility] in [place], states they put her on semaglutide for weight loss. Reports that she started it about 9 weeks ago and she hasn't lost any weight so far. Is worried but this and wanted to ask about it. Pt states that she is also taking estrogen and progesterone.   Pt is also reporting stomach issues, states that she is trying to switch her care to a different GI doctor. States that she continues to have intense RUQ pain, she had a HIDA scan and CT calcium score to looks for causes, also had an EGD which did not show any acute pathology. States she is getting more constipated with the semaglutide she is taking. I counseled the patient about taking daily fiber and miralax to help reduce the risk of constipation.     Current Outpatient Medications  Medication Instructions   amphetamine-dextroamphetamine (ADDERALL XR) 20 MG 24 hr capsule 20 mg, Oral, BH-each morning   [START ON 12/15/2023] amphetamine-dextroamphetamine (ADDERALL XR) 20 MG 24 hr capsule 20 mg, Oral, BH-each morning   [START ON 01/09/2024] amphetamine-dextroamphetamine (ADDERALL XR) 20 MG 24 hr capsule 20 mg, Oral, BH-each morning   Cholecalciferol (VITAMIN D-3 PO) 5,000 Units, Every other day   escitalopram (LEXAPRO) 10 mg, Oral, Daily   estradiol (VIVELLE-DOT) 0.1 mg, Transdermal, 2 times weekly   NON FORMULARY Number of herbs and supplements   progesterone (PROMETRIUM) 100 mg, Oral, Daily   SEMAGLUTIDE-WEIGHT MANAGEMENT [place] 2.5 mg   valACYclovir (VALTREX) 1000 MG tablet TAKE 2 TABLETS TWICE A DAY X 1 DAY  AT ONSET OF SYMPTOMS    Patient Active Problem List   Diagnosis Date Noted   Acute thoracic back pain 04/25/2022   Depressive disorder 04/10/2021   Abdominal pain, generalized 10/18/2020   Irritable bowel syndrome with diarrhea 10/18/2020   Allergic rhinitis 08/23/2020   Ear itching 05/05/2020   Neck pain on left side 05/05/2020   Sensorineural hearing loss (SNHL) of left ear with unrestricted hearing of right ear 05/05/2020   Temporomandibular joint dysfunction 05/05/2020   Depression, major, single episode, moderate (HCC) 01/08/2019   ADD (attention deficit disorder) 01/08/2019   History of Hashimoto thyroiditis 01/08/2019   Abnormality of breast on screening mammography 01/08/2019   Recurrent cold sores    Fibroepithelioma 08/26/2013      Review of Systems  All other systems reviewed and are negative.     Objective:     BP 100/62   Pulse 80   Temp 98.2 F (36.8 C) (Oral)   Ht 5' 5.5" (1.664 m)   Wt 167 lb 3.2 oz (75.8 kg)   SpO2 100%   BMI 27.40 kg/m    Physical Exam Vitals reviewed.  Constitutional:      Appearance: Normal appearance. She is well-groomed and normal weight.  Neck:     Thyroid: No thyromegaly.  Cardiovascular:     Rate and Rhythm: Normal rate and regular rhythm.     Heart sounds: S1 normal and S2 normal.  Pulmonary:     Effort: Pulmonary effort  is normal.     Breath sounds: Normal breath sounds and air entry.  Neurological:     Mental Status: She is alert and oriented to person, place, and time. Mental status is at baseline.     Gait: Gait is intact.  Psychiatric:        Mood and Affect: Mood and affect normal.        Speech: Speech normal.        Behavior: Behavior normal.        Judgment: Judgment normal.      No results found for any visits on 11/19/23.    The ASCVD Risk score (Arnett DK, et al., 2019) failed to calculate for the following reasons:   Unable to determine if patient is Non-Hispanic African American     Assessment & Plan:  Other specified attention deficit hyperactivity disorder (ADHD) Assessment & Plan: Will refill her adderall for the next 3 months. I reminded the patient about the DEA guidelines and that she will need to be seen at least every 4-6 months for her refills. Pt voiced understanding.   Orders: -     Amphetamine-Dextroamphet ER; Take 1 capsule (20 mg total) by mouth every morning.  Dispense: 30 capsule; Refill: 0 -     Amphetamine-Dextroamphet ER; Take 1 capsule (20 mg total) by mouth every morning.  Dispense: 30 capsule; Refill: 0 -     Amphetamine-Dextroamphet ER; Take 1 capsule (20 mg total) by mouth every morning.  Dispense: 30 capsule; Refill: 0     Return in about 3 months (around 02/19/2024) for annual physical exam.    Karie Georges, MD

## 2023-12-02 ENCOUNTER — Telehealth: Payer: Self-pay | Admitting: Pediatrics

## 2023-12-02 NOTE — Telephone Encounter (Signed)
 Inbound call from patient stating that she has an appointment with Dr. Doy Hutching on 5/21 8:50 and is requesting a call from the nurse to discuss her symptoms in the meantime. Patient is experiencing abdominal pain and change in bowel habits. Please advise.

## 2023-12-03 NOTE — Telephone Encounter (Signed)
 Called and spoke with patient. Patient has been advised that she has not been seen in over a year and Dr. Doy Hutching has never seen her. Patient will need evaluation before we can provide recommendations. I offered patient an appt with Dr. Doy Hutching later this week but patient is not available due to work. Patient is only off on Wednesdays. Patient's appt has been rescheduled to Wednesday, 12/10/23 at 1:30 pm. Patient has been advised to seek care at Urgent care if she has any worsening symptoms prior to her appt. Patient verbalized understanding and had no concerns at the end of the call.
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
## 2023-12-10 ENCOUNTER — Ambulatory Visit: Admitting: Pediatrics

## 2023-12-10 ENCOUNTER — Ambulatory Visit (INDEPENDENT_AMBULATORY_CARE_PROVIDER_SITE_OTHER)
Admission: RE | Admit: 2023-12-10 | Discharge: 2023-12-10 | Disposition: A | Discharge status: Home / Self Care | Source: Ambulatory Visit | Attending: Pediatrics | Admitting: Pediatrics

## 2023-12-10 ENCOUNTER — Encounter: Payer: Self-pay | Admitting: Pediatrics

## 2023-12-10 VITALS — BP 126/68 | HR 87 | Ht 65.5 in | Wt 163.8 lb

## 2023-12-10 DIAGNOSIS — N393 Stress incontinence (female) (male): Secondary | ICD-10-CM

## 2023-12-10 DIAGNOSIS — K582 Mixed irritable bowel syndrome: Secondary | ICD-10-CM

## 2023-12-10 DIAGNOSIS — R1084 Generalized abdominal pain: Secondary | ICD-10-CM

## 2023-12-10 DIAGNOSIS — K589 Irritable bowel syndrome without diarrhea: Secondary | ICD-10-CM

## 2023-12-10 DIAGNOSIS — K5641 Fecal impaction: Secondary | ICD-10-CM

## 2023-12-10 MED ORDER — IBGARD 90 MG PO CPCR: ORAL_CAPSULE | ORAL | 0 refills | Status: DC

## 2023-12-10 NOTE — Patient Instructions (Signed)
 Your provider has requested that you have an abdominal x ray before leaving today. Please go to the basement floor to our Radiology department for the test.   We have given you samples of the following medication to take: IBGard take as directed.  We have sent a referral for pelvic floor therapy.   Follow up in 2-3 months.  Thank you for entrusting me with your care and for choosing [facility], Dr. Eugenia Hess  _______________________________________________________  If your blood pressure at your visit was 140/90 or greater, please contact your primary care physician to follow up on this.  _______________________________________________________  If you are age 12 or older, your body mass index should be between 23-30. Your Body mass index is 26.84 kg/m. If this is out of the aforementioned range listed, please consider follow up with your Primary Care Provider.  If you are age 74 or younger, your body mass index should be between 19-25. Your Body mass index is 26.84 kg/m. If this is out of the aformentioned range listed, please consider follow up with your Primary Care Provider.   ________________________________________________________  The [facility] providers would like to encourage you to use MYCHART to communicate with providers for non-urgent requests or questions.  Due to long hold times on the telephone, sending your provider a message by [facility] may be a faster and more efficient way to get a response.  Please allow 48 business hours for a response.  Please remember that this is for non-urgent requests.  _______________________________________________________

## 2023-12-17 ENCOUNTER — Other Ambulatory Visit (HOSPITAL_COMMUNITY)
Admission: RE | Admit: 2023-12-17 | Discharge: 2023-12-17 | Disposition: A | Discharge status: Home / Self Care | Source: Ambulatory Visit | Attending: Radiology | Admitting: Radiology

## 2023-12-17 ENCOUNTER — Encounter: Payer: Self-pay | Admitting: Pediatrics

## 2023-12-17 ENCOUNTER — Ambulatory Visit (INDEPENDENT_AMBULATORY_CARE_PROVIDER_SITE_OTHER): Admitting: Radiology

## 2023-12-17 ENCOUNTER — Encounter: Payer: Self-pay | Admitting: Radiology

## 2023-12-17 VITALS — BP 126/70 | HR 93 | Ht 66.0 in | Wt 169.0 lb

## 2023-12-17 DIAGNOSIS — Z1331 Encounter for screening for depression: Secondary | ICD-10-CM | POA: Diagnosis not present

## 2023-12-17 DIAGNOSIS — N8189 Other female genital prolapse: Secondary | ICD-10-CM

## 2023-12-17 DIAGNOSIS — Z79890 Hormone replacement therapy (postmenopausal): Secondary | ICD-10-CM

## 2023-12-17 DIAGNOSIS — Z6827 Body mass index (BMI) 27.0-27.9, adult: Secondary | ICD-10-CM

## 2023-12-17 DIAGNOSIS — Z01419 Encounter for gynecological examination (general) (routine) without abnormal findings: Secondary | ICD-10-CM

## 2023-12-17 DIAGNOSIS — E663 Overweight: Secondary | ICD-10-CM | POA: Diagnosis not present

## 2023-12-17 DIAGNOSIS — R8290 Unspecified abnormal findings in urine: Secondary | ICD-10-CM | POA: Diagnosis not present

## 2023-12-17 LAB — URINALYSIS, COMPLETE W/RFL CULTURE
Bacteria, UA: NONE SEEN /HPF
Bilirubin Urine: NEGATIVE
Glucose, UA: NEGATIVE
Hgb urine dipstick: NEGATIVE
Hyaline Cast: NONE SEEN /LPF
Ketones, ur: NEGATIVE
Leukocyte Esterase: NEGATIVE
Nitrites, Initial: NEGATIVE
Protein, ur: NEGATIVE
RBC / HPF: NONE SEEN /HPF (ref 0–2)
Specific Gravity, Urine: 1.01 (ref 1.001–1.035)
WBC, UA: NONE SEEN /HPF (ref 0–5)
pH: 6 (ref 5.0–8.0)

## 2023-12-17 LAB — NO CULTURE INDICATED

## 2023-12-17 MED ORDER — ESTRADIOL 0.1 MG/24HR TD PTTW
1.0000 | MEDICATED_PATCH | TRANSDERMAL | 4 refills | Status: AC
Start: 2023-12-18 — End: ?

## 2023-12-17 MED ORDER — PROGESTERONE MICRONIZED 100 MG PO CAPS: 200.0000 mg | ORAL_CAPSULE | Freq: Every evening | ORAL | 4 refills | Status: AC

## 2023-12-17 MED ORDER — ZEPBOUND 2.5 MG/0.5ML ~~LOC~~ SOLN: 2.5000 mg | SUBCUTANEOUS | 0 refills | Status: DC

## 2023-12-17 NOTE — Progress Notes (Signed)
 Yolanda Santana 13-Nov-1970 [id number]   History: Postmenopausal 53 y.o. presents for annual exam.Complains of odor in urine and intermittent pelvic cramping. Symptoms x's couple months. NP thyroid  30 mg prescribed by [facility]. Currently on compounded semaglutide .75mg , not losing any weight, would like to switch. Pelvic floor weakness and leakage, needs recommendation for therapist, has referral already.   Gynecologic History Postmenopausal Last Pap: 2020. Results were: normal Last mammogram: 2024. Results were: normal Last colonoscopy: 2021 HRT use: yes  Obstetric History OB History  Gravida Para Term Preterm AB Living  2 2    2   SAB IAB Ectopic Multiple Live Births      2    # Outcome Date GA Lbr Len/2nd Weight Sex Type Anes PTL Lv  2 Para           1 Para                12/17/2023    1:37 PM 11/19/2023    9:36 AM 06/04/2023   12:42 PM  Depression screen PHQ 2/9  Decreased Interest 1 1 1   Down, Depressed, Hopeless 1 1 1   PHQ - 2 Score 2 2 2   Altered sleeping 0 1 2  Tired, decreased energy 3 3 2   Change in appetite 0 0 1  Feeling bad or failure about yourself  0 0 1  Trouble concentrating 2 0 1  Moving slowly or fidgety/restless 0 0 0  Suicidal thoughts 0 0 0  PHQ-9 Score 7 6 9   Difficult doing work/chores Somewhat difficult  Somewhat difficult     The following portions of the patient's history were reviewed and updated as appropriate: allergies, current medications, past family history, past medical history, past social history, past surgical history, and problem list.  Review of Systems Pertinent items noted in HPI and remainder of comprehensive ROS otherwise negative.  Past medical history, past surgical history, family history and social history were all reviewed and documented in the EPIC chart.  Exam:  Vitals:   12/17/23 1335  BP: 126/70  Pulse: 93  SpO2: 98%  Weight: 169 lb (76.7 kg)  Height: 5\' 6"  (1.676 m)   Body mass index is 27.28 kg/m.  General  appearance:  Normal Thyroid :  Symmetrical, normal in size, without palpable masses or nodularity. Respiratory  Auscultation:  Clear without wheezing or rhonchi Cardiovascular  Auscultation:  Regular rate, without rubs, murmurs or gallops  Edema/varicosities:  Not grossly evident Abdominal  Soft,nontender, without masses, guarding or rebound.  Liver/spleen:  No organomegaly noted  Hernia:  None appreciated  Skin  Inspection:  Grossly normal Breasts: Examined lying and sitting.   Right: Without masses, retractions, nipple discharge or axillary adenopathy.   Left: Without masses, retractions, nipple discharge or axillary adenopathy. Genitourinary   Inguinal/mons:  Normal without inguinal adenopathy  External genitalia:  Normal appearing vulva with no masses, tenderness, or lesions  BUS/Urethra/Skene's glands:  Normal  Vagina:  Normal appearing with normal color and discharge, no lesions.   Cervix:  Normal appearing without discharge or lesions  Uterus:  Normal in size, shape and contour.  Midline and mobile, nontender  Adnexa/parametria:     Rt: Normal in size, without masses or tenderness.   Lt: Normal in size, without masses or tenderness.  Anus and perineum: Normal    Ellis Guys, CMA present for exam  Assessment/Plan:   1. Well woman exam with routine gynecological exam (Primary) - Cytology - PAP( [facility])  2. Hormone replacement therapy (HRT) Increase  progesterone  based on ERT patch - progesterone  (PROMETRIUM ) 100 MG capsule; Take 2 capsules (200 mg total) by mouth at bedtime.  Dispense: 180 capsule; Refill: 4 - estradiol  (VIVELLE -DOT) 0.1 MG/24HR patch; Place 1 patch (0.1 mg total) onto the skin 2 (two) times a week.  Dispense: 24 patch; Refill: 4  3. Abnormal urine odor Reassured normal - Urinalysis,Complete w/RFL Culture  4. Overweight with body mass index (BMI) of 27 to 27.9 in adult Switch from compounded semaglutide to zepbound self pay Follow up in 7 weeks (  still had 3 weeks of semaglutide left ) - tirzepatide (ZEPBOUND) 2.5 MG/0.5ML injection vial; Inject 2.5 mg into the skin once a week.  Dispense: 2 mL; Refill: 0  5. Pelvic floor weakness in female PFPT referral    Synetta Eves B WHNP-BC, 2:42 PM 12/17/2023

## 2023-12-17 NOTE — Patient Instructions (Signed)
 Preventive Care 54-55 Years Old, Female  Preventive care refers to lifestyle choices and visits with your health care provider that can promote health and wellness. Preventive care visits are also called wellness exams.  What can I expect for my preventive care visit?  Counseling  Your health care provider may ask you questions about your:  Medical history, including:  Past medical problems.  Family medical history.  Pregnancy history.  Current health, including:  Menstrual cycle.  Method of birth control.  Emotional well-being.  Home life and relationship well-being.  Sexual activity and sexual health.  Lifestyle, including:  Alcohol, nicotine or tobacco, and drug use.  Access to firearms.  Diet, exercise, and sleep habits.  Work and work Astronomer.  Sunscreen use.  Safety issues such as seatbelt and bike helmet use.  Physical exam  Your health care provider will check your:  Height and weight. These may be used to calculate your BMI (body mass index). BMI is a measurement that tells if you are at a healthy weight.  Waist circumference. This measures the distance around your waistline. This measurement also tells if you are at a healthy weight and may help predict your risk of certain diseases, such as type 2 diabetes and high blood pressure.  Heart rate and blood pressure.  Body temperature.  Skin for abnormal spots.  What immunizations do I need?    Vaccines are usually given at various ages, according to a schedule. Your health care provider will recommend vaccines for you based on your age, medical history, and lifestyle or other factors, such as travel or where you work.  What tests do I need?  Screening  Your health care provider may recommend screening tests for certain conditions. This may include:  Lipid and cholesterol levels.  Diabetes screening. This is done by checking your blood sugar (glucose) after you have not eaten for a while (fasting).  Pelvic exam and Pap test.  Hepatitis B test.  Hepatitis C  test.  HIV (human immunodeficiency virus) test.  STI (sexually transmitted infection) testing, if you are at risk.  Lung cancer screening.  Colorectal cancer screening.  Mammogram. Talk with your health care provider about when you should start having regular mammograms. This may depend on whether you have a family history of breast cancer.  BRCA-related cancer screening. This may be done if you have a family history of breast, ovarian, tubal, or peritoneal cancers.  Bone density scan. This is done to screen for osteoporosis.  Talk with your health care provider about your test results, treatment options, and if necessary, the need for more tests.  Follow these instructions at home:  Eating and drinking    Eat a diet that includes fresh fruits and vegetables, whole grains, lean protein, and low-fat dairy products.  Take vitamin and mineral supplements as recommended by your health care provider.  Do not drink alcohol if:  Your health care provider tells you not to drink.  You are pregnant, may be pregnant, or are planning to become pregnant.  If you drink alcohol:  Limit how much you have to 0-1 drink a day.  Know how much alcohol is in your drink. In the [place] one drink equals one 12 oz bottle of beer (355 mL), one 5 oz glass of wine (148 mL), or one 1 oz glass of hard liquor (44 mL).  Lifestyle  Brush your teeth every morning and night with fluoride toothpaste. Floss one time each day.  Exercise for at least  30 minutes 5 or more days each week.  Do not use any products that contain nicotine or tobacco. These products include cigarettes, chewing tobacco, and vaping devices, such as e-cigarettes. If you need help quitting, ask your health care provider.  Do not use drugs.  If you are sexually active, practice safe sex. Use a condom or other form of protection to prevent STIs.  If you do not wish to become pregnant, use a form of birth control. If you plan to become pregnant, see your health care provider for a  prepregnancy visit.  Take aspirin only as told by your health care provider. Make sure that you understand how much to take and what form to take. Work with your health care provider to find out whether it is safe and beneficial for you to take aspirin daily.  Find healthy ways to manage stress, such as:  Meditation, yoga, or listening to music.  Journaling.  Talking to a trusted person.  Spending time with friends and family.  Minimize exposure to UV radiation to reduce your risk of skin cancer.  Safety  Always wear your seat belt while driving or riding in a vehicle.  Do not drive:  If you have been drinking alcohol. Do not ride with someone who has been drinking.  When you are tired or distracted.  While texting.  If you have been using any mind-altering substances or drugs.  Wear a helmet and other protective equipment during sports activities.  If you have firearms in your house, make sure you follow all gun safety procedures.  Seek help if you have been physically or sexually abused.  What's next?  Visit your health care provider once a year for an annual wellness visit.  Ask your health care provider how often you should have your eyes and teeth checked.  Stay up to date on all vaccines.  This information is not intended to replace advice given to you by your health care provider. Make sure you discuss any questions you have with your health care provider.  Document Revised: 02/07/2021 Document Reviewed: 02/07/2021  Elsevier Patient Education  2024 [employer].

## 2023-12-19 LAB — CYTOLOGY - PAP
Comment: NEGATIVE
Diagnosis: NEGATIVE
High risk HPV: NEGATIVE

## 2023-12-29 ENCOUNTER — Other Ambulatory Visit: Payer: Self-pay | Admitting: Family Medicine

## 2023-12-29 DIAGNOSIS — F908 Attention-deficit hyperactivity disorder, other type: Secondary | ICD-10-CM

## 2023-12-29 MED ORDER — AMPHETAMINE-DEXTROAMPHET ER 20 MG PO CP24: 20.0000 mg | ORAL_CAPSULE | ORAL | 0 refills | Status: DC

## 2024-01-05 ENCOUNTER — Other Ambulatory Visit: Payer: Self-pay | Admitting: Radiology

## 2024-01-05 DIAGNOSIS — Z6827 Body mass index (BMI) 27.0-27.9, adult: Secondary | ICD-10-CM

## 2024-01-05 NOTE — Telephone Encounter (Signed)
 Med refill request: Zepbound 2.5mg /0.76mL Last AEX: 12/17/2023-JC Next AEX: 12/21/2024-JC, 7 week f/u wt check on 02/04/2024. Last MMG (if hormonal med): n/a Refill authorized: rx pend.   Last rx sent for on 12/17/2023.

## 2024-01-06 NOTE — Telephone Encounter (Signed)
 Please check to see if she would like the same dose or to increase to 5mg . We typically increase at 4 weeks.

## 2024-01-06 NOTE — Telephone Encounter (Signed)
 Spoke w/ the pt and she said that she actually started the zepbound already, reported sending mychart msg on 4/26 stating that since the semaglutide wasn't working for her she went ahead and ordered the zepbound and started. So, will need the refills.  Pt also had to r/s appt from 6/11 @ 11 to 6/13 @ 0830, arrival at 0815 since started new job.

## 2024-01-06 NOTE — Telephone Encounter (Signed)
 Too early. Should have finished her remaining 3 weeks of semaglutide then the 4 weeks to get her to the 7 week follow up weight check. Can we follow up to see if she did as planned?

## 2024-01-08 ENCOUNTER — Other Ambulatory Visit: Payer: Self-pay | Admitting: Radiology

## 2024-01-08 ENCOUNTER — Ambulatory Visit: Admitting: Pediatrics

## 2024-01-08 DIAGNOSIS — Z6827 Body mass index (BMI) 27.0-27.9, adult: Secondary | ICD-10-CM

## 2024-01-08 MED ORDER — ZEPBOUND 5 MG/0.5ML ~~LOC~~ SOLN: 5.0000 mg | SUBCUTANEOUS | 0 refills | Status: DC

## 2024-01-08 NOTE — Telephone Encounter (Signed)
 Rx sent.

## 2024-01-14 ENCOUNTER — Ambulatory Visit: Admitting: Pediatrics

## 2024-01-20 MED ORDER — LINACLOTIDE 145 MCG PO CAPS: 145.0000 ug | ORAL_CAPSULE | Freq: Every day | ORAL | 3 refills | Status: DC

## 2024-01-20 NOTE — Telephone Encounter (Signed)
 Please submit PA for Linzess 145 mcg daily. Thanks

## 2024-01-20 NOTE — Addendum Note (Signed)
 Addended by: Miraya Cudney N on: 01/20/2024 04:37 PM   Modules accepted: Orders

## 2024-01-22 ENCOUNTER — Other Ambulatory Visit (HOSPITAL_COMMUNITY): Payer: Self-pay

## 2024-01-22 ENCOUNTER — Telehealth: Payer: Self-pay

## 2024-01-22 NOTE — Telephone Encounter (Signed)
 Pharmacy Patient Advocate Encounter   Received notification from Patient Advice Request messages that prior authorization for LINZESS 145 MCG CAPS capsule is required/requested.   Insurance verification completed.   The patient is insured through [employer] .   Per test claim: The current 30 day co-pay is, $$526.91.  No PA needed at this time. This test claim was processed through [facility]- copay amounts may vary at other pharmacies due to pharmacy/plan contracts, or as the patient moves through the different stages of their insurance plan.

## 2024-01-22 NOTE — Telephone Encounter (Signed)
 Medication filled 01-20-2024 but has high co-pay of $526.91 for #30 per 30 days

## 2024-01-27 ENCOUNTER — Other Ambulatory Visit: Payer: Self-pay | Admitting: Radiology

## 2024-01-27 DIAGNOSIS — Z6827 Body mass index (BMI) 27.0-27.9, adult: Secondary | ICD-10-CM

## 2024-01-27 NOTE — Telephone Encounter (Signed)
 Med refill request: Zepbound 5mg  Last AEX: 12/17/23  Next OV:  02/06/24 Next AEX: 12/21/24 Last MMG (if hormonal med) n/a Refill authorized: Please approve or deny as appropriate.

## 2024-01-28 NOTE — Telephone Encounter (Signed)
 Spoke to patient, patient states she does not need refill at this time (she has enough for this week and next).  She wants to wait until after appointment next week, before she gets RX refilled.  Patient also states she does not have insurance at this time and was unsure how that worked for her visit next week.  I let her know I would send a message to our front desk and have them call her back.  Patient verbalized understanding and agreed.  Sent to provider for review.

## 2024-01-28 NOTE — Telephone Encounter (Signed)
 Does she want to increase again? Her appt was pushed to next week.

## 2024-02-04 ENCOUNTER — Ambulatory Visit: Admitting: Radiology

## 2024-02-06 ENCOUNTER — Ambulatory Visit (INDEPENDENT_AMBULATORY_CARE_PROVIDER_SITE_OTHER): Payer: Self-pay | Admitting: Radiology

## 2024-02-06 ENCOUNTER — Encounter: Payer: Self-pay | Admitting: Radiology

## 2024-02-06 VITALS — BP 102/64 | HR 82 | Wt 162.0 lb

## 2024-02-06 DIAGNOSIS — E663 Overweight: Secondary | ICD-10-CM

## 2024-02-06 DIAGNOSIS — Z6827 Body mass index (BMI) 27.0-27.9, adult: Secondary | ICD-10-CM

## 2024-02-06 MED ORDER — ZEPBOUND 7.5 MG/0.5ML ~~LOC~~ SOLN: 7.5000 mg | SUBCUTANEOUS | 1 refills | Status: DC

## 2024-02-06 NOTE — Progress Notes (Signed)
   Yolanda Santana 1971-04-09 [id number]   History:  53 y.o. G2P2 presents for weight management. Doing well on Zepbound  5mg . Has lost 7lbs, feels less inflamed. Would like to increase her dose.  Gynecologic History No LMP recorded. Patient has had an ablation.   Obstetric History OB History  Gravida Para Term Preterm AB Living  2 2    2   SAB IAB Ectopic Multiple Live Births      2    # Outcome Date GA Lbr Len/2nd Weight Sex Type Anes PTL Lv  2 Para           1 Para                12/17/2023    1:37 PM 11/19/2023    9:36 AM 06/04/2023   12:42 PM  Depression screen PHQ 2/9  Decreased Interest 1 1 1   Down, Depressed, Hopeless 1 1 1   PHQ - 2 Score 2 2 2   Altered sleeping 0 1 2  Tired, decreased energy 3 3 2   Change in appetite 0 0 1  Feeling bad or failure about yourself  0 0 1  Trouble concentrating 2 0 1  Moving slowly or fidgety/restless 0 0 0  Suicidal thoughts 0 0 0  PHQ-9 Score 7 6 9   Difficult doing work/chores Somewhat difficult  Somewhat difficult     The following portions of the patient's history were reviewed and updated as appropriate: allergies, current medications, past family history, past medical history, past social history, past surgical history, and problem list.  Review of Systems  All other systems reviewed and are negative.   Past medical history, past surgical history, family history and social history were all reviewed and documented in the EPIC chart.  Exam:  Vitals:   02/06/24 0855  BP: 102/64  Pulse: 82  SpO2: 99%  Weight: 162 lb (73.5 kg)   Body mass index is 26.15 kg/m.  Physical Exam Vitals reviewed.  Constitutional:      Appearance: Normal appearance. She is overweight.  Pulmonary:     Effort: Pulmonary effort is normal.   Neurological:     Mental Status: She is alert.   Psychiatric:        Mood and Affect: Mood normal.        Thought Content: Thought content normal.        Judgment: Judgment normal.      Ellis Guys,  CMA present for exam  Starting weight:169 Current weight:162 First goal:155 Overall weight loss goal:145  Assessment/Plan:   1. Overweight with body mass index (BMI) of 27 to 27.9 in adult (Primary) Will increase to 7.5mg  as she is tolerating well. Will contact me if she wants to increase to 10mg  before her next fill. - tirzepatide  (ZEPBOUND ) 7.5 MG/0.5ML injection vial; Inject 7.5 mg into the skin once a week.  Dispense: 2 mL; Refill: 1     Risks and benefits discussed. Continue to focus on protein and fiber. Small frequent meals. Adequate hydration and electrolyte replacement. Will continue to exercise for at least a week including weight bearing exercise.   Return in about 3 months (around 05/08/2024) for Med Follow-up.  Synetta Eves B WHNP-BC 9:20 AM 02/06/2024

## 2024-02-11 DIAGNOSIS — K589 Irritable bowel syndrome without diarrhea: Secondary | ICD-10-CM

## 2024-02-16 NOTE — Telephone Encounter (Signed)
 GI referral pended.   Last AEX 12/17/23  Routing to Jami to advise if ok to proceed with referral.

## 2024-02-17 NOTE — Telephone Encounter (Signed)
 Ok to refer to [facility] GI Dr Burnette

## 2024-02-17 NOTE — Telephone Encounter (Signed)
 Routing to [employer] regarding referral.   Encounter closed.

## 2024-02-19 ENCOUNTER — Encounter: Admitting: Family Medicine

## 2024-02-25 ENCOUNTER — Encounter: Admitting: Family Medicine

## 2024-02-26 ENCOUNTER — Telehealth: Payer: Self-pay

## 2024-02-26 NOTE — Telephone Encounter (Signed)
 [facility] gastroenterology called and said they received a referral & needed the last OV, labs & any imaging done. These were faxed to their office at [phone].

## 2024-03-10 ENCOUNTER — Ambulatory Visit: Admitting: Gastroenterology

## 2024-03-19 ENCOUNTER — Other Ambulatory Visit: Payer: Self-pay | Admitting: Radiology

## 2024-03-19 ENCOUNTER — Telehealth: Payer: Self-pay

## 2024-03-19 DIAGNOSIS — E663 Overweight: Secondary | ICD-10-CM

## 2024-03-19 NOTE — Telephone Encounter (Signed)
 LM for patient TCB regarding refill request received for Zepbound  7.5 mg.  Jami needs to know if patient wants to continue 7.5 strength or go up to 10 mg. Also sent patient a MyChart message.

## 2024-03-19 NOTE — Telephone Encounter (Signed)
 Med refill request: Zepbound  7.5 mg Last OV: 02/06/24 Last AEX: 12/20/23 Next AEX: 12/21/24  Last MMG (if hormonal med) n/a Refill authorized: Please Advise?

## 2024-03-23 ENCOUNTER — Other Ambulatory Visit: Payer: Self-pay | Admitting: Radiology

## 2024-03-23 DIAGNOSIS — E663 Overweight: Secondary | ICD-10-CM

## 2024-03-23 MED ORDER — ZEPBOUND 10 MG/0.5ML ~~LOC~~ SOLN: 10.0000 mg | SUBCUTANEOUS | 0 refills | Status: DC

## 2024-03-29 ENCOUNTER — Encounter: Payer: Self-pay | Admitting: Family Medicine

## 2024-03-29 ENCOUNTER — Ambulatory Visit: Payer: Self-pay | Admitting: Family Medicine

## 2024-03-29 VITALS — BP 120/62 | HR 78 | Temp 98.9°F | Ht 65.0 in | Wt 158.0 lb

## 2024-03-29 DIAGNOSIS — Z114 Encounter for screening for human immunodeficiency virus [HIV]: Secondary | ICD-10-CM | POA: Diagnosis not present

## 2024-03-29 DIAGNOSIS — Z1159 Encounter for screening for other viral diseases: Secondary | ICD-10-CM | POA: Diagnosis not present

## 2024-03-29 DIAGNOSIS — F902 Attention-deficit hyperactivity disorder, combined type: Secondary | ICD-10-CM

## 2024-03-29 DIAGNOSIS — F32A Depression, unspecified: Secondary | ICD-10-CM

## 2024-03-29 DIAGNOSIS — Z23 Encounter for immunization: Secondary | ICD-10-CM

## 2024-03-29 DIAGNOSIS — Z0000 Encounter for general adult medical examination without abnormal findings: Secondary | ICD-10-CM

## 2024-03-29 DIAGNOSIS — Z13220 Encounter for screening for lipoid disorders: Secondary | ICD-10-CM | POA: Diagnosis not present

## 2024-03-29 MED ORDER — LISDEXAMFETAMINE DIMESYLATE 40 MG PO CAPS: 40.0000 mg | ORAL_CAPSULE | ORAL | 0 refills | Status: AC

## 2024-03-29 MED ORDER — ESCITALOPRAM OXALATE 10 MG PO TABS: 10.0000 mg | ORAL_TABLET | Freq: Every day | ORAL | 1 refills | Status: DC

## 2024-03-29 MED ORDER — LISDEXAMFETAMINE DIMESYLATE 40 MG PO CAPS
40.0000 mg | ORAL_CAPSULE | ORAL | 0 refills | Status: AC
Start: 2024-03-29 — End: ?

## 2024-03-29 NOTE — Progress Notes (Unsigned)
 Complete physical exam  Patient: Yolanda Santana   DOB: August 11, 1971   53 y.o. Female  MRN: [id number]  Subjective:    Chief Complaint  Patient presents with   Annual Exam    Yolanda Santana is a 53 y.o. female who presents today for a complete physical exam. She reports consuming a general diet. Home exercise routine includes walking 1-2 hrs per week. She generally feels well. She reports sleeping poorly, pt states she dreams all night long and then wakes up exhausted, does have nighttime awakenings frequently. Is taking magnesium at night to help her sleep, thinks it helps somewhat.  She does not have additional problems to discuss today.    Most recent fall risk assessment:    06/04/2023   12:41 PM  Fall Risk   Falls in the past year? 0  Number falls in past yr: 0  Injury with Fall? 0  Risk for fall due to : No Fall Risks     Most recent depression screenings:    03/29/2024    4:05 PM 12/17/2023    1:37 PM  PHQ 2/9 Scores  PHQ - 2 Score 1 2  PHQ- 9 Score 5 7    Vision:{Last Opthalmologist Visit:27382} and Dental: {CHL AMB ROS DENTAL (Optional):27383::No current dental problems}  {History (Optional):23778}  Patient Care Team: Ozell Heron HERO, MD as PCP - General (Family Medicine) Ginette Shasta NOVAK, NP as Nurse Practitioner (Radiology)   Outpatient Medications Prior to Visit  Medication Sig   Cholecalciferol (VITAMIN D -3 PO) Take 5,000 Units by mouth every other day.   escitalopram  (LEXAPRO ) 10 MG tablet TAKE 1 TABLET BY MOUTH EVERY DAY   estradiol  (VIVELLE -DOT) 0.1 MG/24HR patch Place 1 patch (0.1 mg total) onto the skin 2 (two) times a week.   MAGNESIUM CITRATE PO Take by mouth at bedtime.   NON FORMULARY Number of herbs and supplements   NP THYROID  30 MG tablet Take 30 mg by mouth daily.   progesterone  (PROMETRIUM ) 100 MG capsule Take 2 capsules (200 mg total) by mouth at bedtime.   tirzepatide  (ZEPBOUND ) 7.5 MG/0.5ML Pen Inject 7.5 mg into the skin once a week.    valACYclovir  (VALTREX ) 1000 MG tablet TAKE 2 TABLETS TWICE A DAY X 1 DAY AT ONSET OF SYMPTOMS   [DISCONTINUED] amphetamine -dextroamphetamine (ADDERALL XR) 20 MG 24 hr capsule Take 1 capsule (20 mg total) by mouth every morning.   [DISCONTINUED] amphetamine -dextroamphetamine (ADDERALL XR) 20 MG 24 hr capsule Take 1 capsule (20 mg total) by mouth every morning.   [DISCONTINUED] linaclotide  (LINZESS ) 145 MCG CAPS capsule Take 1 capsule (145 mcg total) by mouth daily before breakfast.   [DISCONTINUED] Peppermint Oil (IBGARD) 90 MG CPCR Use as directed.   [DISCONTINUED] tirzepatide  (ZEPBOUND ) 10 MG/0.5ML injection vial Inject 10 mg into the skin once a week.   No facility-administered medications prior to visit.    Review of Systems  HENT:  Negative for hearing loss.   Eyes:  Negative for blurred vision.  Respiratory:  Negative for shortness of breath.   Cardiovascular:  Negative for chest pain.  Gastrointestinal: Negative.   Genitourinary: Negative.   Musculoskeletal:  Negative for back pain.  Neurological:  Negative for headaches.  Psychiatric/Behavioral:  Negative for depression.        Objective:     BP 120/62   Pulse 78   Temp 98.9 F (37.2 C) (Oral)   Ht 5' 5 (1.651 m)   Wt 158 lb (71.7 kg)   SpO2 97%  BMI 26.29 kg/m  {Vitals History (Optional):23777}  Physical Exam Vitals reviewed.  Constitutional:      Appearance: Normal appearance. She is well-groomed and normal weight.  HENT:     Right Ear: Tympanic membrane and ear canal normal.     Left Ear: Tympanic membrane and ear canal normal.     Mouth/Throat:     Mouth: Mucous membranes are moist.     Pharynx: No posterior oropharyngeal erythema.  Eyes:     Conjunctiva/sclera: Conjunctivae normal.  Neck:     Thyroid : No thyromegaly.  Cardiovascular:     Rate and Rhythm: Normal rate and regular rhythm.     Pulses: Normal pulses.     Heart sounds: S1 normal and S2 normal.  Pulmonary:     Effort: Pulmonary effort is  normal.     Breath sounds: Normal breath sounds and air entry.  Abdominal:     General: Abdomen is flat. Bowel sounds are normal.     Palpations: Abdomen is soft.  Musculoskeletal:     Right lower leg: No edema.     Left lower leg: No edema.  Lymphadenopathy:     Cervical: No cervical adenopathy.  Neurological:     Mental Status: She is alert and oriented to person, place, and time. Mental status is at baseline.     Gait: Gait is intact.  Psychiatric:        Mood and Affect: Mood and affect normal.        Speech: Speech normal.        Behavior: Behavior normal.        Judgment: Judgment normal.      No results found for any visits on 03/29/24. {Show previous labs (optional):23779}    Assessment & Plan:    Routine Health Maintenance and Physical Exam  Immunization History  Administered Date(s) Administered   Hepatitis B 06/18/2013, 11/04/2013, 10/27/2014   Influenza-Unspecified 05/28/2014, 06/09/2020   PFIZER(Purple Top)SARS-COV-2 Vaccination 12/25/2019, 01/17/2020   Td 05/03/1992   Tdap 10/30/2010, 08/26/2013   Zoster Recombinant(Shingrix ) 12/10/2021    Health Maintenance  Topic Date Due   HIV Screening  Never done   Hepatitis C Screening  Never done   COVID-19 Vaccine (3 - Pfizer risk series) 02/14/2020   Pneumococcal Vaccine: 50+ Years (1 of 1 - PCV) Never done   Zoster Vaccines- Shingrix  (2 of 2) 02/04/2022   DTaP/Tdap/Td (4 - Td or Tdap) 08/27/2023   INFLUENZA VACCINE  03/26/2024   MAMMOGRAM  06/11/2025   Cervical Cancer Screening (HPV/Pap Cotest)  12/16/2028   Colonoscopy  07/05/2030   Hepatitis B Vaccines  Completed   HPV VACCINES  Aged Out   Meningococcal B Vaccine  Aged Out    Discussed health benefits of physical activity, and encouraged her to engage in regular exercise appropriate for her age and condition.  Immunization due -     Tdap vaccine greater than or equal to 7yo IM  Need for hepatitis C screening test -     Hepatitis C antibody;  Future  Encounter for screening for HIV -     HIV Antibody (routine testing w rflx); Future  Lipid screening -     Lipid panel; Future  Depressive disorder  Attention deficit hyperactivity disorder (ADHD), combined type -     CBC with Differential/Platelet; Future -     TSH; Future -     Comprehensive metabolic panel with GFR; Future  Routine general medical examination at a health care facility  No follow-ups on file.     Heron CHRISTELLA Sharper, MD

## 2024-03-29 NOTE — Patient Instructions (Addendum)
 Fiber --35 grams per day  Probiotics 2-3 times per day  Miralax 1 scoop per day  Health Maintenance, Female Adopting a healthy lifestyle and getting preventive care are important in promoting health and wellness. Ask your health care provider about: The right schedule for you to have regular tests and exams. Things you can do on your own to prevent diseases and keep yourself healthy. What should I know about diet, weight, and exercise? Eat a healthy diet  Eat a diet that includes plenty of vegetables, fruits, low-fat dairy products, and lean protein. Do not eat a lot of foods that are high in solid fats, added sugars, or sodium. Maintain a healthy weight Body mass index (BMI) is used to identify weight problems. It estimates body fat based on height and weight. Your health care provider can help determine your BMI and help you achieve or maintain a healthy weight. Get regular exercise Get regular exercise. This is one of the most important things you can do for your health. Most adults should: Exercise for at least 150 minutes each week. The exercise should increase your heart rate and make you sweat (moderate-intensity exercise). Do strengthening exercises at least twice a week. This is in addition to the moderate-intensity exercise. Spend less time sitting. Even light physical activity can be beneficial. Watch cholesterol and blood lipids Have your blood tested for lipids and cholesterol at 54 years of age, then have this test every 5 years. Have your cholesterol levels checked more often if: Your lipid or cholesterol levels are high. You are older than 54 years of age. You are at high risk for heart disease. What should I know about cancer screening? Depending on your health history and family history, you may need to have cancer screening at various ages. This may include screening for: Breast cancer. Cervical cancer. Colorectal cancer. Skin cancer. Lung cancer. What should I  know about heart disease, diabetes, and high blood pressure? Blood pressure and heart disease High blood pressure causes heart disease and increases the risk of stroke. This is more likely to develop in people who have high blood pressure readings or are overweight. Have your blood pressure checked: Every 3-5 years if you are 56-40 years of age. Every year if you are 54 years old or older. Diabetes Have regular diabetes screenings. This checks your fasting blood sugar level. Have the screening done: Once every three years after age 32 if you are at a normal weight and have a low risk for diabetes. More often and at a younger age if you are overweight or have a high risk for diabetes. What should I know about preventing infection? Hepatitis B If you have a higher risk for hepatitis B, you should be screened for this virus. Talk with your health care provider to find out if you are at risk for hepatitis B infection. Hepatitis C Testing is recommended for: Everyone born from [birth year] through 1966. Anyone with known risk factors for hepatitis C. Sexually transmitted infections (STIs) Get screened for STIs, including gonorrhea and chlamydia, if: You are sexually active and are younger than 54 years of age. You are older than 54 years of age and your health care provider tells you that you are at risk for this type of infection. Your sexual activity has changed since you were last screened, and you are at increased risk for chlamydia or gonorrhea. Ask your health care provider if you are at risk. Ask your health care provider about whether you are  at high risk for HIV. Your health care provider may recommend a prescription medicine to help prevent HIV infection. If you choose to take medicine to prevent HIV, you should first get tested for HIV. You should then be tested every 3 months for as long as you are taking the medicine. Pregnancy If you are about to stop having your period (premenopausal) and  you may become pregnant, seek counseling before you get pregnant. Take 400 to 800 micrograms (mcg) of folic acid every day if you become pregnant. Ask for birth control (contraception) if you want to prevent pregnancy. Osteoporosis and menopause Osteoporosis is a disease in which the bones lose minerals and strength with aging. This can result in bone fractures. If you are 62 years old or older, or if you are at risk for osteoporosis and fractures, ask your health care provider if you should: Be screened for bone loss. Take a calcium or vitamin D  supplement to lower your risk of fractures. Be given hormone replacement therapy (HRT) to treat symptoms of menopause. Follow these instructions at home: Alcohol use Do not drink alcohol if: Your health care provider tells you not to drink. You are pregnant, may be pregnant, or are planning to become pregnant. If you drink alcohol: Limit how much you have to: 0-1 drink a day. Know how much alcohol is in your drink. In the [place] one drink equals one 12 oz bottle of beer (355 mL), one 5 oz glass of wine (148 mL), or one 1 oz glass of hard liquor (44 mL). Lifestyle Do not use any products that contain nicotine or tobacco. These products include cigarettes, chewing tobacco, and vaping devices, such as e-cigarettes. If you need help quitting, ask your health care provider. Do not use street drugs. Do not share needles. Ask your health care provider for help if you need support or information about quitting drugs. General instructions Schedule regular health, dental, and eye exams. Stay current with your vaccines. Tell your health care provider if: You often feel depressed. You have ever been abused or do not feel safe at home. Summary Adopting a healthy lifestyle and getting preventive care are important in promoting health and wellness. Follow your health care provider's instructions about healthy diet, exercising, and getting tested or screened  for diseases. Follow your health care provider's instructions on monitoring your cholesterol and blood pressure. This information is not intended to replace advice given to you by your health care provider. Make sure you discuss any questions you have with your health care provider. Document Revised: 01/01/2021 Document Reviewed: 01/01/2021 Elsevier Patient Education  2024 [employer].

## 2024-04-01 ENCOUNTER — Telehealth: Payer: Self-pay

## 2024-04-01 NOTE — Telephone Encounter (Signed)
 Pharmacy Patient Advocate Encounter   Received notification from CoverMyMeds that prior authorization for Lisdexamfetamine Dimesylate  40MG  capsules  is required/requested.   Insurance verification completed.   The patient is insured through [employer] .   Per test claim: PA required; PA started via CoverMyMeds. KEY [id number] . Waiting for clinical questions to populate.

## 2024-04-02 NOTE — Telephone Encounter (Signed)
 Pharmacy Patient Advocate Encounter  Received notification from [facility] that Prior Authorization for Lisdexamfetamine 40mg  caps has been APPROVED from 04/02/24 to 04/02/25   PA #/Case ID/Reference #: [id number]  Approval letter indexed to media tab

## 2024-04-02 NOTE — Telephone Encounter (Signed)
 PLEASE BE ADVISED Clinical questions have been answered and PA submitted.TO PLAN. PA currently Pending.

## 2024-04-05 NOTE — Telephone Encounter (Signed)
 Left a detailed message on the pharmacy voicemail at [facility] with the approval information below.

## 2024-04-14 ENCOUNTER — Other Ambulatory Visit: Payer: Self-pay | Admitting: Radiology

## 2024-04-14 DIAGNOSIS — Z6827 Body mass index (BMI) 27.0-27.9, adult: Secondary | ICD-10-CM

## 2024-04-15 ENCOUNTER — Other Ambulatory Visit (INDEPENDENT_AMBULATORY_CARE_PROVIDER_SITE_OTHER)

## 2024-04-15 DIAGNOSIS — Z114 Encounter for screening for human immunodeficiency virus [HIV]: Secondary | ICD-10-CM

## 2024-04-15 DIAGNOSIS — F902 Attention-deficit hyperactivity disorder, combined type: Secondary | ICD-10-CM | POA: Diagnosis not present

## 2024-04-15 DIAGNOSIS — Z13220 Encounter for screening for lipoid disorders: Secondary | ICD-10-CM | POA: Diagnosis not present

## 2024-04-15 DIAGNOSIS — Z1159 Encounter for screening for other viral diseases: Secondary | ICD-10-CM

## 2024-04-15 LAB — CBC WITH DIFFERENTIAL/PLATELET
Basophils Absolute: 0.1 K/uL (ref 0.0–0.1)
Basophils Relative: 1.3 % (ref 0.0–3.0)
Eosinophils Absolute: 0.3 K/uL (ref 0.0–0.7)
Eosinophils Relative: 5.5 % — ABNORMAL HIGH (ref 0.0–5.0)
HCT: 39 % (ref 36.0–46.0)
Hemoglobin: 13.3 g/dL (ref 12.0–15.0)
Lymphocytes Relative: 31.9 % (ref 12.0–46.0)
Lymphs Abs: 1.7 K/uL (ref 0.7–4.0)
MCHC: 34.3 g/dL (ref 30.0–36.0)
MCV: 94.3 fl (ref 78.0–100.0)
Monocytes Absolute: 0.5 K/uL (ref 0.1–1.0)
Monocytes Relative: 9.6 % (ref 3.0–12.0)
Neutro Abs: 2.7 K/uL (ref 1.4–7.7)
Neutrophils Relative %: 51.7 % (ref 43.0–77.0)
Platelets: 242 K/uL (ref 150.0–400.0)
RBC: 4.13 Mil/uL (ref 3.87–5.11)
RDW: 13.2 % (ref 11.5–15.5)
WBC: 5.2 K/uL (ref 4.0–10.5)

## 2024-04-15 LAB — COMPREHENSIVE METABOLIC PANEL WITH GFR
ALT: 9 U/L (ref 0–35)
AST: 14 U/L (ref 0–37)
Albumin: 4.3 g/dL (ref 3.5–5.2)
Alkaline Phosphatase: 42 U/L (ref 39–117)
BUN: 10 mg/dL (ref 6–23)
CO2: 30 meq/L (ref 19–32)
Calcium: 9.1 mg/dL (ref 8.4–10.5)
Chloride: 102 meq/L (ref 96–112)
Creatinine, Ser: 0.8 mg/dL (ref 0.40–1.20)
GFR: 83.76 mL/min (ref >60.00)
Glucose, Bld: 83 mg/dL (ref 70–99)
Potassium: 4.2 meq/L (ref 3.5–5.1)
Sodium: 139 meq/L (ref 135–145)
Total Bilirubin: 0.6 mg/dL (ref 0.2–1.2)
Total Protein: 6.6 g/dL (ref 6.0–8.3)

## 2024-04-15 LAB — LIPID PANEL
Cholesterol: 160 mg/dL (ref 0–200)
HDL: 76.4 mg/dL (ref >39.00)
LDL Cholesterol: 74 mg/dL (ref 0–99)
NonHDL: 83.86
Total CHOL/HDL Ratio: 2
Triglycerides: 48 mg/dL (ref 0.0–149.0)
VLDL: 9.6 mg/dL (ref 0.0–40.0)

## 2024-04-15 LAB — TSH: TSH: 2.57 u[IU]/mL (ref 0.35–5.50)

## 2024-04-15 NOTE — Telephone Encounter (Signed)
 Med refill request: Zepbound  10 mg Last OV: 02/06/24 Last AEX: 12/17/23 Next AEX: 12/21/24 Last MMG (if hormonal med) n/a The original prescription was discontinued on 03/29/2024 by Ozell Heron HERO, MD for the following reason: Dose change. Renewing this prescription may not be appropriate.  Sent to provider for review.

## 2024-04-16 LAB — HIV ANTIBODY (ROUTINE TESTING W REFLEX): HIV 1&2 Ab, 4th Generation: NONREACTIVE

## 2024-04-16 LAB — HEPATITIS C ANTIBODY: Hepatitis C Ab: NONREACTIVE

## 2024-04-19 ENCOUNTER — Ambulatory Visit: Payer: Self-pay | Admitting: Family Medicine

## 2024-04-21 ENCOUNTER — Other Ambulatory Visit: Payer: Self-pay | Admitting: Radiology

## 2024-04-21 DIAGNOSIS — Z6827 Body mass index (BMI) 27.0-27.9, adult: Secondary | ICD-10-CM

## 2024-04-21 NOTE — Telephone Encounter (Signed)
 Med requested: Tirzepatide  (Zepbound ) 10 mg / 0.05 ml  Started: 03/23/24 - #2 ml with 0 refills  (7.5 mg ordered on 03/29/24)  Last Aex: 12/17/23 Next Aex: 12/21/24 Refill authorized? Please advise.

## 2024-05-26 ENCOUNTER — Other Ambulatory Visit: Payer: Self-pay | Admitting: Radiology

## 2024-05-26 DIAGNOSIS — Z6827 Body mass index (BMI) 27.0-27.9, adult: Secondary | ICD-10-CM

## 2024-05-26 NOTE — Telephone Encounter (Signed)
.  Med refill request: Zepbound  Last AEX: 12/17/23 Next AEX: 12/21/24 Last MMG (if hormonal med) Refill authorized: Please Advise?

## 2024-06-11 ENCOUNTER — Other Ambulatory Visit: Payer: Self-pay | Admitting: Family Medicine

## 2024-06-11 DIAGNOSIS — Z1231 Encounter for screening mammogram for malignant neoplasm of breast: Secondary | ICD-10-CM

## 2024-06-11 LAB — LAB REPORT - SCANNED: EGFR: 79

## 2024-06-24 ENCOUNTER — Other Ambulatory Visit: Payer: Self-pay | Admitting: Radiology

## 2024-06-24 DIAGNOSIS — E663 Overweight: Secondary | ICD-10-CM

## 2024-06-24 NOTE — Telephone Encounter (Signed)
 Med refill request:   ZEPBOUND  10 MG/0.5ML injection vial  Start:  05/26/24 Disp:  2 mL Refills:  0  Last OV: 02/06/24 Last AEX:  12/17/23 Next AEX:  12/21/24 Last MMG (if hormonal med):  N/A Refill authorized? Please Advise.

## 2024-06-29 ENCOUNTER — Ambulatory Visit
Admission: RE | Admit: 2024-06-29 | Discharge: 2024-06-29 | Disposition: A | Source: Ambulatory Visit | Attending: Family Medicine

## 2024-06-29 DIAGNOSIS — Z1231 Encounter for screening mammogram for malignant neoplasm of breast: Secondary | ICD-10-CM

## 2024-07-01 ENCOUNTER — Ambulatory Visit: Payer: Self-pay | Admitting: Family Medicine

## 2024-07-05 ENCOUNTER — Other Ambulatory Visit: Payer: Self-pay | Admitting: Medical Genetics

## 2024-07-07 ENCOUNTER — Other Ambulatory Visit

## 2024-07-07 DIAGNOSIS — Z006 Encounter for examination for normal comparison and control in clinical research program: Secondary | ICD-10-CM

## 2024-07-09 ENCOUNTER — Telehealth: Admitting: Radiology

## 2024-07-19 LAB — GENECONNECT MOLECULAR SCREEN: Genetic Analysis Overall Interpretation: NEGATIVE

## 2024-08-24 ENCOUNTER — Telehealth: Payer: Self-pay | Admitting: *Deleted

## 2024-08-24 NOTE — Telephone Encounter (Signed)
 Copied from CRM [id number]. Topic: Clinical - Request for Lab/Test Order >> Aug 24, 2024  9:21 AM Isabell A wrote: Reason for CRM:  Consuelo from [facility] states CT is scheduled for 1/5 - calling to confirm if prior authorization has been completed, needs response by friday 08/27/24 at 10 am.   Callback number: [phone]

## 2024-08-30 ENCOUNTER — Inpatient Hospital Stay
Admission: RE | Admit: 2024-08-30 | Discharge: 2024-08-30 | Disposition: A | Source: Ambulatory Visit | Attending: Acute Care | Admitting: Acute Care

## 2024-08-30 DIAGNOSIS — Z87891 Personal history of nicotine dependence: Secondary | ICD-10-CM

## 2024-08-30 DIAGNOSIS — Z122 Encounter for screening for malignant neoplasm of respiratory organs: Secondary | ICD-10-CM

## 2024-09-01 ENCOUNTER — Other Ambulatory Visit: Payer: Self-pay

## 2024-09-01 DIAGNOSIS — Z122 Encounter for screening for malignant neoplasm of respiratory organs: Secondary | ICD-10-CM

## 2024-09-01 DIAGNOSIS — Z87891 Personal history of nicotine dependence: Secondary | ICD-10-CM

## 2024-09-30 ENCOUNTER — Other Ambulatory Visit: Payer: Self-pay | Admitting: Family Medicine

## 2024-09-30 DIAGNOSIS — F32A Depression, unspecified: Secondary | ICD-10-CM

## 2024-12-21 ENCOUNTER — Ambulatory Visit: Payer: Self-pay | Admitting: Radiology
